# Patient Record
Sex: Male | Born: 1935 | Hispanic: No | Smoking: Former smoker
Health system: Southern US, Community
[De-identification: ages and names within clinical notes are randomized; demographics above are authoritative.]

## PROBLEM LIST (undated history)

## (undated) DIAGNOSIS — E079 Disorder of thyroid, unspecified: Secondary | ICD-10-CM

## (undated) DIAGNOSIS — I10 Essential (primary) hypertension: Secondary | ICD-10-CM

## (undated) DIAGNOSIS — S42309A Unspecified fracture of shaft of humerus, unspecified arm, initial encounter for closed fracture: Secondary | ICD-10-CM

## (undated) DIAGNOSIS — I509 Heart failure, unspecified: Secondary | ICD-10-CM

---

## 2005-02-03 ENCOUNTER — Ambulatory Visit: Payer: Self-pay

## 2009-10-08 ENCOUNTER — Emergency Department: Payer: Self-pay | Admitting: Emergency Medicine

## 2013-09-04 ENCOUNTER — Inpatient Hospital Stay: Payer: Self-pay | Admitting: Internal Medicine

## 2013-09-04 LAB — COMPREHENSIVE METABOLIC PANEL
Albumin: 3 g/dL — ABNORMAL LOW (ref 3.4–5.0)
Alkaline Phosphatase: 75 U/L (ref 50–136)
Anion Gap: 0 — ABNORMAL LOW (ref 7–16)
Chloride: 104 mmol/L (ref 98–107)
Co2: 34 mmol/L — ABNORMAL HIGH (ref 21–32)
Creatinine: 1.82 mg/dL — ABNORMAL HIGH (ref 0.60–1.30)
EGFR (African American): 36 — ABNORMAL LOW
Potassium: 3.6 mmol/L (ref 3.5–5.1)
SGOT(AST): 26 U/L (ref 15–37)
Sodium: 138 mmol/L (ref 136–145)

## 2013-09-04 LAB — URINALYSIS, COMPLETE
Bilirubin,UR: NEGATIVE
Glucose,UR: NEGATIVE mg/dL (ref 0–75)
Hyaline Cast: 5
Leukocyte Esterase: NEGATIVE
Nitrite: NEGATIVE
Protein: 30
RBC,UR: NONE SEEN /HPF (ref 0–5)
Specific Gravity: 1.023 (ref 1.003–1.030)
Squamous Epithelial: NONE SEEN
WBC UR: 1 /HPF (ref 0–5)

## 2013-09-04 LAB — DIFFERENTIAL
Basophil #: 0 10*3/uL (ref 0.0–0.1)
Eosinophil #: 0.1 10*3/uL (ref 0.0–0.7)
Monocyte #: 0.6 x10 3/mm (ref 0.2–1.0)
Monocyte %: 6.3 %
Neutrophil #: 7.5 10*3/uL — ABNORMAL HIGH (ref 1.4–6.5)
Neutrophil %: 85.6 %

## 2013-09-04 LAB — BILIRUBIN, DIRECT: Bilirubin, Direct: 0.4 mg/dL — ABNORMAL HIGH (ref 0.00–0.20)

## 2013-09-04 LAB — CBC
HCT: 34.7 % — ABNORMAL LOW (ref 40.0–52.0)
Platelet: 182 10*3/uL (ref 150–440)

## 2013-09-05 LAB — COMPREHENSIVE METABOLIC PANEL WITH GFR
Albumin: 2.6 g/dL — ABNORMAL LOW
Alkaline Phosphatase: 68 U/L
Anion Gap: 3 — ABNORMAL LOW
BUN: 23 mg/dL — ABNORMAL HIGH
Bilirubin,Total: 1.4 mg/dL — ABNORMAL HIGH
Calcium, Total: 8.5 mg/dL
Chloride: 105 mmol/L
Co2: 32 mmol/L
Creatinine: 1.5 mg/dL — ABNORMAL HIGH
EGFR (African American): 45 — ABNORMAL LOW
EGFR (Non-African Amer.): 39 — ABNORMAL LOW
Glucose: 101 mg/dL — ABNORMAL HIGH
Osmolality: 283
Potassium: 4.1 mmol/L
SGOT(AST): 21 U/L
SGPT (ALT): 14 U/L
Sodium: 140 mmol/L
Total Protein: 6.3 g/dL — ABNORMAL LOW

## 2013-09-05 LAB — CBC WITH DIFFERENTIAL/PLATELET
Basophil #: 0 10*3/uL (ref 0.0–0.1)
Eosinophil #: 0 10*3/uL (ref 0.0–0.7)
Eosinophil %: 0.5 %
HGB: 11 g/dL — ABNORMAL LOW (ref 13.0–18.0)
Lymphocyte #: 0.5 10*3/uL — ABNORMAL LOW (ref 1.0–3.6)
Lymphocyte %: 5.5 %
MCH: 35.1 pg — ABNORMAL HIGH (ref 26.0–34.0)
MCHC: 34.5 g/dL (ref 32.0–36.0)
MCV: 102 fL — ABNORMAL HIGH (ref 80–100)
Monocyte #: 0.6 x10 3/mm (ref 0.2–1.0)
Neutrophil %: 86.8 %
RBC: 3.13 10*6/uL — ABNORMAL LOW (ref 4.40–5.90)
RDW: 13.1 % (ref 11.5–14.5)
WBC: 8.3 10*3/uL (ref 3.8–10.6)

## 2013-09-05 LAB — TROPONIN I: Troponin-I: 0.03 ng/mL

## 2013-09-06 LAB — BASIC METABOLIC PANEL
Anion Gap: 3 — ABNORMAL LOW (ref 7–16)
Calcium, Total: 9 mg/dL (ref 8.5–10.1)
Chloride: 107 mmol/L (ref 98–107)
Glucose: 150 mg/dL — ABNORMAL HIGH (ref 65–99)
Osmolality: 286 (ref 275–301)
Sodium: 139 mmol/L (ref 136–145)

## 2013-09-06 LAB — CBC WITH DIFFERENTIAL/PLATELET
Basophil %: 0 %
Eosinophil #: 0 10*3/uL (ref 0.0–0.7)
Eosinophil %: 0 %
HCT: 30.4 % — ABNORMAL LOW (ref 40.0–52.0)
HGB: 10.7 g/dL — ABNORMAL LOW (ref 13.0–18.0)
Lymphocyte %: 5.2 %
MCH: 35.3 pg — ABNORMAL HIGH (ref 26.0–34.0)
MCHC: 35.1 g/dL (ref 32.0–36.0)
Monocyte #: 0.1 x10 3/mm — ABNORMAL LOW (ref 0.2–1.0)
Monocyte %: 1.7 %
Neutrophil #: 7 10*3/uL — ABNORMAL HIGH (ref 1.4–6.5)
Neutrophil %: 93.1 %
Platelet: 189 10*3/uL (ref 150–440)
RBC: 3.03 10*6/uL — ABNORMAL LOW (ref 4.40–5.90)

## 2013-09-07 LAB — BASIC METABOLIC PANEL
Anion Gap: 5 — ABNORMAL LOW (ref 7–16)
BUN: 34 mg/dL — ABNORMAL HIGH (ref 7–18)
Co2: 29 mmol/L (ref 21–32)
EGFR (African American): 43 — ABNORMAL LOW
EGFR (Non-African Amer.): 38 — ABNORMAL LOW
Glucose: 153 mg/dL — ABNORMAL HIGH (ref 65–99)
Osmolality: 290 (ref 275–301)
Sodium: 140 mmol/L (ref 136–145)

## 2013-09-09 LAB — CULTURE, BLOOD (SINGLE)

## 2013-10-04 ENCOUNTER — Emergency Department: Payer: Self-pay

## 2013-10-04 LAB — BASIC METABOLIC PANEL
Anion Gap: 7 (ref 7–16)
Calcium, Total: 9.4 mg/dL (ref 8.5–10.1)
Chloride: 103 mmol/L (ref 98–107)
Co2: 28 mmol/L (ref 21–32)
EGFR (African American): 28 — ABNORMAL LOW
Glucose: 136 mg/dL — ABNORMAL HIGH (ref 65–99)
Osmolality: 282 (ref 275–301)
Potassium: 4.3 mmol/L (ref 3.5–5.1)
Sodium: 138 mmol/L (ref 136–145)

## 2013-10-04 LAB — URINALYSIS, COMPLETE
Blood: NEGATIVE
Hyaline Cast: 8
Ketone: NEGATIVE
Nitrite: NEGATIVE
Ph: 5 (ref 4.5–8.0)
Protein: NEGATIVE
RBC,UR: <1 /HPF (ref 0–5)
Specific Gravity: 1.009 (ref 1.003–1.030)

## 2013-10-04 LAB — CBC
HCT: 34.8 % — ABNORMAL LOW (ref 40.0–52.0)
HGB: 11.8 g/dL — ABNORMAL LOW (ref 13.0–18.0)
MCV: 103 fL — ABNORMAL HIGH (ref 80–100)
Platelet: 232 10*3/uL (ref 150–440)
WBC: 5.1 10*3/uL (ref 3.8–10.6)

## 2015-02-22 NOTE — Discharge Summary (Signed)
PATIENT NAME:  Jermaine Mcclain, Jermaine Mcclain MR#:  [id number] DATE OF BIRTH:  01/28/1915  DATE OF ADMISSION:  09/04/2013 DATE OFMarisa Cyphers PLANNED DISCHARGE:  09/07/2013    DISCHARGE DIAGNOSES:  1. Acute respiratory failure from chronic obstructive pulmonary disease exacerbation and pneumonia.  2. Pneumonia.  3. Chronic obstructive pulmonary disease exacerbation.  4. Encephalopathy. from hypoxia and pneumonia.   DISCHARGE MEDICATIONS: Per [facility] medication reconciliation system. Basically, will have a prednisone taper 60, decreasing 20 mg every 5 days until gone, as well as 4 days of Levaquin 500 mg daily and Advair and tiotropium inhalers for now. He will stay on his other medicines.   HISTORY AND PHYSICAL: Please see detailed history and physical done on admission.   HOSPITAL COURSE: The patient was admitted for the above, stabilized on IV Levaquin initially, though had more and more wheezing, which improved markedly with IV steroids 20 mg Solu-Medrol b.i.d. It did not seem to significantly worsen his breathing. He remained somewhat hypoxic, although he was 96% on 2 liters today. Will see if he needs home oxygen for now, hopefully will clear as the pneumonia clears. Try to get home health nursing to watch him closely given his confusion and the fact that he is 89, lives alone, etc. He did not appear to need home health physical therapy as he walked 250 feet yesterday. His chronic kidney disease is stable with a creatinine of 1.5 presently. WBC 7.5, hemoglobin is 10.7 today, likely related to the CKD, as noted. Troponins were negative, done on admission. Blood cultures were negative at 48 hours. Creatinine initially was 1.82 notably.    TIME SPENT: Please note, it took approximately 35 minutes to do all discharge tasks today.   ____________________________ Marya AmslerMarshall Mcclain. Dareen PianoAnderson, MD mwa:lb D: 09/07/2013 08:10:17 ET T: 09/07/2013 08:19:45 ET JOB#: [id number]  cc: Marya AmslerMarshall Mcclain. Dareen PianoAnderson, MD, <Dictator> Lauro RegulusMARSHALL Mcclain ANDERSON  MD ELECTRONICALLY SIGNED 09/07/2013 13:15

## 2015-02-22 NOTE — H&P (Signed)
PATIENT NAME:  Marisa CyphersGARLAND, Akshat W MR#:  [id number] DATE OF BIRTH:  07-07-1915  PRIMARY CARE PHYSICIAN: Dr. Dareen PianoAnderson.  HISTORY OF PRESENT ILLNESS: The patient is a 81 year old Caucasian male with past medical history significant for history of dementia, hypertension, hypothyroidism, B12 deficiency, neuropathy in the right lower extremity, who presents to the hospital with complaints of generalized weakness. According to the patient's daughters who are present during my interview, the patient has been living in independent living facility. He has been noted to be very weak on Sunday as well as Saturday over the past 2 days.   He also has productive cough. He was not able to get up and go to church yesterday due to generalized weakness.   On arrival to the Emergency Room, he was noted to have O2 saturations of 85% to 89% on room air. He was given nasal cannula oxygen and hospitalist services were contacted for admission since his chest x-ray revealed left lower lobe pneumonia.   PAST MEDICAL HISTORY: Significant for history of hypertension, hypothyroidism, B12 deficiency, neuropathy, dementia.   MEDICATIONS: Per medical records, the patient is on amlodipine 2.5 mg p.o. daily, aspirin 81 mg p.o. daily, Centrum Silver multivitamins 1 daily, levothyroxine 125 mcg daily, Tylenol 325 mg one tablet in the morning and 2 tablets at bedtime, vitamin B12 500 mg daily at bedtime.   PAST SURGICAL HISTORY: Teeth extraction.   ALLERGIES: GABAPENTIN WHICH GIVES HIM SOMNOLENCE.   FAMILY HISTORY: The patient's father had rheumatoid arthritis, sister, diabetes mellitus. The patient's son died of surgical complications.   SOCIAL HISTORY: The patient is divorced, has 3 girls who live close by. Used to smoke, however quit 50 years ago. No alcohol abuse. He lives in independent living facility. He used to be in Control and instrumentation engineerfurniture sales.   REVIEW OF SYSTEMS: Difficult to obtain as the patient is demented. However, denies any  shortness of breath, chest pains or weakness. Admits of having some right foot pain.   PHYSICAL EXAMINATION:  VITAL SIGNS: On arrival to the Emergency Room, temperature was 98.2, pulse was 86, respiratory rate was 16, blood pressure 108/56, saturation was 89% on room air.  GENERAL: A well-developed, well-nourished Caucasian male, somewhat pale, sitting on the stretcher.  HEENT: Pupils are equal, reactive to light. Extraocular movements intact. No icterus or conjunctivitis. Has difficulty hearing. He has hearing aids ears in his ears. NECK: No masses, supple, nontender. Thyroid is not enlarged. No adenopathy. No JVD or carotid bruits. Bilaterally full range of motion.  LUNGS: Not clear to auscultation in all fields. The patient does have crackles in the left side of his lungs anteriorly as well as posteriorly. A few rhonchi were heard as well as diminished breath sounds on the left. No wheezing. No labored inspirations. Increased effort to breathe. No dullness to percussion. Not in overt respiratory distress.  CARDIOVASCULAR: S1, S2 appreciated. No murmurs, gallops, or rubs were noted. Heart  rhythm was regular.  PMI not lateralized. Chest is nontender to palpation.  EXTREMITIES: 1 plus dorsalis pedis pulses. No lower extremity edema, calf tenderness or cyanosis were noted.  ABDOMEN: Soft, nontender. Bowel sounds are present. No organomegaly or masses were noted.  RECTAL: Deferred.  MUSCLE STRENGTH: Able to move all extremities. No cyanosis, degenerative joint disease or kyphosis. Gait is not tested.  SKIN: No skin rashes, lesions, erythema, nodularity or induration. Warm and dry to palpation.  LYMPHATIC: No adenopathy in the cervical region.  NEUROLOGICAL: Cranial nerves grossly intact. Sensory is intact. No dysarthria, aphasia.  The patient is alert, oriented to person and place, cooperative; however, memory is significantly impaired. He is confused but no agitation or depression noted.   LABORATORY  DATA: BMP showed a glucose of 105, BUN and creatinine were 25 and 1.82, respectively. Otherwise, BMP was unremarkable. The patient's bicarbonate level was high at 34. Estimated GFR for non-African American would be 31. Liver enzymes revealed a total bilirubin elevation to 1.7 and albumin level of 3.0, otherwise unremarkable. Troponin level is 0.03. White blood cell count was normal at 8.7, hemoglobin was 11.9, platelet count 182, MCV high at 102.   Chest x-ray, PA and lateral, 3rd of November 2014, revealed atelectasis versus infiltrate in left lower lobe.   EKG showed sinus rhythm with first-degree AV block at 83 beats per minute, normal axis, inferior infarct, age undetermined. No acute ST-T changes were noted.   ASSESSMENT AND PLAN:  1.  Acute respiratory failure. Admit the patient to the medical floor. Continue him on oxygen through nasal cannula.  2.  Pneumonia. Will initiate the patient on Levaquin. We will get sputum cultures.  3.  Acute renal failure. We will continue IV fluids, get urinalysis as well as postvoid residual. Place Foley catheter if needed.  4.  Anemia. Get guaiac.  5.  Elevated total bilirubin. Get direct bilirubin.  6.  Relative hypotension. Will continue IV fluids and we will hold Norvasc at this time.   TIME SPENT: 50 minutes on this patient.   ____________________________ Katharina Caper, MD rv:np D: 09/04/2013 18:04:01 ET T: 09/04/2013 18:32:48 ET JOB#: [id number]  cc: Katharina Caper, MD, <Dictator> Marya Amsler. Dareen Piano, MD Katharina Caper MD ELECTRONICALLY SIGNED 10/10/2013 15:28

## 2015-10-31 ENCOUNTER — Emergency Department: Payer: Medicare Other

## 2015-10-31 ENCOUNTER — Emergency Department
Admission: EM | Admit: 2015-10-31 | Discharge: 2015-10-31 | Disposition: A | Discharge status: Home / Self Care | Payer: Medicare Other | Attending: Emergency Medicine | Admitting: Emergency Medicine

## 2015-10-31 DIAGNOSIS — Y9289 Other specified places as the place of occurrence of the external cause: Secondary | ICD-10-CM | POA: Insufficient documentation

## 2015-10-31 DIAGNOSIS — Y998 Other external cause status: Secondary | ICD-10-CM | POA: Insufficient documentation

## 2015-10-31 DIAGNOSIS — S6992XA Unspecified injury of left wrist, hand and finger(s), initial encounter: Secondary | ICD-10-CM | POA: Diagnosis present

## 2015-10-31 DIAGNOSIS — Y9389 Activity, other specified: Secondary | ICD-10-CM | POA: Insufficient documentation

## 2015-10-31 DIAGNOSIS — S0990XA Unspecified injury of head, initial encounter: Secondary | ICD-10-CM | POA: Diagnosis not present

## 2015-10-31 DIAGNOSIS — S52592A Other fractures of lower end of left radius, initial encounter for closed fracture: Secondary | ICD-10-CM | POA: Insufficient documentation

## 2015-10-31 DIAGNOSIS — W19XXXA Unspecified fall, initial encounter: Secondary | ICD-10-CM

## 2015-10-31 DIAGNOSIS — Z87891 Personal history of nicotine dependence: Secondary | ICD-10-CM | POA: Diagnosis not present

## 2015-10-31 DIAGNOSIS — W01198A Fall on same level from slipping, tripping and stumbling with subsequent striking against other object, initial encounter: Secondary | ICD-10-CM | POA: Diagnosis not present

## 2015-10-31 DIAGNOSIS — S52502A Unspecified fracture of the lower end of left radius, initial encounter for closed fracture: Secondary | ICD-10-CM

## 2015-10-31 DIAGNOSIS — I10 Essential (primary) hypertension: Secondary | ICD-10-CM | POA: Insufficient documentation

## 2015-10-31 DIAGNOSIS — S60222A Contusion of left hand, initial encounter: Secondary | ICD-10-CM | POA: Diagnosis not present

## 2015-10-31 HISTORY — DX: Essential (primary) hypertension: I10

## 2015-10-31 HISTORY — DX: Heart failure, unspecified: I50.9

## 2015-10-31 HISTORY — DX: Disorder of thyroid, unspecified: E07.9

## 2015-10-31 MED ORDER — OXYCODONE-ACETAMINOPHEN 5-325 MG PO TABS
ORAL_TABLET | ORAL | Status: AC
  Filled 2015-10-31: qty 1

## 2015-10-31 MED ORDER — OXYCODONE-ACETAMINOPHEN 5-325 MG PO TABS: 1.0000 | ORAL_TABLET | Freq: Four times a day (QID) | ORAL | Status: DC | PRN

## 2015-10-31 MED ORDER — OXYCODONE-ACETAMINOPHEN 5-325 MG PO TABS
1.0000 | ORAL_TABLET | Freq: Once | ORAL | Status: AC
  Administered 2015-10-31: 1 via ORAL

## 2015-10-31 NOTE — Discharge Instructions (Signed)
Wrist Fracture °A wrist fracture is a break or crack in one of the bones of your wrist. Your wrist is made up of eight small bones at the palm of your hand (carpal bones) and two long bones that make up your forearm (radius and ulna). °CAUSES °· A direct blow to the wrist. °· Falling on an outstretched hand. °· Trauma, such as a car accident or a fall. °RISK FACTORS °Risk factors for wrist fracture include: °· Participating in contact and high-risk sports, such as skiing, biking, and ice skating. °· Taking steroid medicines. °· Smoking. °· Being male. °· Being Caucasian. °· Drinking more than three alcoholic beverages per day. °· Having low or lowered bone density (osteoporosis or osteopenia). °· Age. Older adults have decreased bone density. °· Women who have had menopause. °· History of previous fractures. °SIGNS AND SYMPTOMS °Symptoms of wrist fractures include tenderness, bruising, and inflammation. Additionally, the wrist may hang in an odd position or appear deformed. °DIAGNOSIS °Diagnosis may include: °· Physical exam. °· X-ray. °TREATMENT °Treatment depends on many factors, including the nature and location of the fracture, your age, and your activity level. Treatment for wrist fracture can be nonsurgical or surgical. °Nonsurgical Treatment °A plaster cast or splint may be applied to your wrist if the bone is in a good position. If the fracture is not in good position, it may be necessary for your health care provider to realign it before applying a splint or cast. Usually, a cast or splint will be worn for several weeks. °Surgical Treatment °Sometimes the position of the bone is so far out of place that surgery is required to apply a device to hold it together as it heals. Depending on the fracture, there are a number of options for holding the bone in place while it heals, such as a cast and metal pins. °HOME CARE INSTRUCTIONS °· Keep your injured wrist elevated and move your fingers as much as  possible. °· Do not put pressure on any part of your cast or splint. It may break. °· Use a plastic bag to protect your cast or splint from water while bathing or showering. Do not lower your cast or splint into water. °· Take medicines only as directed by your health care provider. °· Keep your cast or splint clean and dry. If it becomes wet, damaged, or suddenly feels too tight, contact your health care provider right away. °· Do not use any tobacco products including cigarettes, chewing tobacco, or electronic cigarettes. Tobacco can delay bone healing. If you need help quitting, ask your health care provider. °· Keep all follow-up visits as directed by your health care provider. This is important. °· Ask your health care provider if you should take supplements of calcium and vitamins C and D to promote bone healing. °SEEK MEDICAL CARE IF: °· Your cast or splint is damaged, breaks, or gets wet. °· You have a fever. °· You have chills. °· You have continued severe pain or more swelling than you did before the cast was put on. °SEEK IMMEDIATE MEDICAL CARE IF: °· Your hand or fingernails on the injured arm turn blue or gray, or feel cold or numb. °· You have decreased feeling in the fingers of your injured arm. °MAKE SURE YOU: °· Understand these instructions. °· Will watch your condition. °· Will get help right away if you are not doing well or get worse. °  °This information is not intended to replace advice given to you by your   health care provider. Make sure you discuss any questions you have with your health care provider. °  °Document Released: 07/29/2005 Document Revised: 07/10/2015 Document Reviewed: 11/06/2011 °Elsevier Interactive Patient Education ©2016 Elsevier Inc. ° °

## 2015-10-31 NOTE — ED Provider Notes (Signed)
[facility] ([place] Emergency Department Provider Note  Time seen: 5:56 PM  I have reviewed the triage vital signs and the nursing notes.   HISTORY  Chief Complaint Fall    HPI Dacoda Finlay Clear is a 81 y.o. male with a past medical history of hypertension, CHF, who presents the emergency department after a fall last night. According to the family and the patient fell backwards last night hitting his head on cement, denies loss of consciousness. They noted that the patient is continuing to complain of left hand/wrist pain so they brought him to the emergency department today. Patient denies any headache, or head pain. Patient does have a small hematoma to the back of his head. Patient complains of moderate left wrist pain worse with any type of movement.     Past Medical History  Diagnosis Date  . Hypertension   . CHF (congestive heart failure) (HCC)   . Thyroid disease     There are no active problems to display for this patient.   History reviewed. No pertinent past surgical history.  No current outpatient prescriptions on file.  Allergies Review of patient's allergies indicates no known allergies.  No family history on file.  Social History Social History  Substance Use Topics  . Smoking status: Former Games developer  . Smokeless tobacco: Never Used  . Alcohol Use: No    Review of Systems Constitutional: Negative for fever. Cardiovascular: Negative for chest pain. Respiratory: Negative for shortness of breath. Gastrointestinal: Negative for abdominal pain Genitourinary: Negative for dysuria. Musculoskeletal: Negative for back pain. Negative for neck pain. Positive for left wrist pain. Skin: Positive for ecchymosis of the left wrist/dorsum of left hand. Neurological: Negative for headache 10-point ROS otherwise negative.  ____________________________________________   PHYSICAL EXAM:  VITAL SIGNS: ED Triage Vitals  Enc Vitals Group   BP 10/31/15 1633 105/59 mmHg     Pulse Rate 10/31/15 1633 81     Resp 10/31/15 1633 18     Temp 10/31/15 1633 98.7 F (37.1 C)     Temp Source 10/31/15 1633 Oral     SpO2 10/31/15 1636 95 %     Weight 10/31/15 1633 161 lb (73.029 kg)     Height 10/31/15 1633  (1.803 m)     Head Cir --      Peak Flow --      Pain Score 10/31/15 1635 5     Pain Loc --      Pain Edu? --      Excl. in GC? --     Constitutional: Alert and oriented. Well appearing and in no distress. Eyes: Normal exam ENT   Head: Normocephalic and atraumatic.   Mouth/Throat: Mucous membranes are moist. Cardiovascular: Normal rate, regular rhythm. No murmur Respiratory: Normal respiratory effort without tachypnea nor retractions. Breath sounds are clear Gastrointestinal: Soft and nontender. No distention.   Musculoskeletal: Moderate tenderness palpation of the left wrist. Ecchymosis of the dorsum of the left hand. Neurovascularly intact. Neurologic:  Normal speech and language. No gross focal neurologic deficits Skin:  Skin is warm, dry and intact.  Psychiatric: Mood and affect are normal. Speech and behavior are normal.   ____________________________________________      RADIOLOGY  CT head is negative for any intracranial abnormality. X-ray shows nondisplaced distal radius fracture.  INITIAL IMPRESSION / ASSESSMENT AND PLAN / ED COURSE  Pertinent labs & imaging results that were available during my care of the patient were reviewed by me and considered in  my medical decision making (see chart for details).  Patient presents after a fall last night. Continues to have left wrist pain so they brought him to the emergency department. Patient appears overall very well. States he only thing wrong with him is his left wrist hurts. Patient has been ambulatory today without difficulty. Patient ambulates without a walker or cane. We will splint his left distal radius fracture with a volar splint, and have the  patient follow up with orthopedics in 2 weeks. We'll place the patient on pain medication, which the daughter states she will dose for the patient. ____________________________________________   FINAL CLINICAL IMPRESSION(S) / ED DIAGNOSES  left distal radius fracture fall  Minna AntisKevin Jance Siek, MD 10/31/15 1800

## 2015-10-31 NOTE — ED Notes (Signed)
Splint and follow-up instructions given to patient and patient's daughter.  Patient's daughter verbalized understanding.

## 2015-10-31 NOTE — ED Notes (Signed)
Pt here with family, states pt fell backwards last night hitting his head on concrete, has small hematoma to the back of head and bruising to the left wrist and hand.. Pt is a/ox4.. In NAD..Marland Kitchen

## 2015-11-11 ENCOUNTER — Emergency Department
Admission: EM | Admit: 2015-11-11 | Discharge: 2015-11-11 | Disposition: A | Discharge status: Home / Self Care | Payer: Medicare Other | Attending: Emergency Medicine | Admitting: Emergency Medicine

## 2015-11-11 ENCOUNTER — Emergency Department: Payer: Medicare Other

## 2015-11-11 ENCOUNTER — Encounter: Payer: Self-pay | Admitting: *Deleted

## 2015-11-11 DIAGNOSIS — S42401B Unspecified fracture of lower end of right humerus, initial encounter for open fracture: Secondary | ICD-10-CM | POA: Diagnosis not present

## 2015-11-11 DIAGNOSIS — Y9389 Activity, other specified: Secondary | ICD-10-CM | POA: Insufficient documentation

## 2015-11-11 DIAGNOSIS — S59901A Unspecified injury of right elbow, initial encounter: Secondary | ICD-10-CM | POA: Diagnosis present

## 2015-11-11 DIAGNOSIS — Z87891 Personal history of nicotine dependence: Secondary | ICD-10-CM | POA: Insufficient documentation

## 2015-11-11 DIAGNOSIS — Y998 Other external cause status: Secondary | ICD-10-CM | POA: Diagnosis not present

## 2015-11-11 DIAGNOSIS — W1839XA Other fall on same level, initial encounter: Secondary | ICD-10-CM | POA: Insufficient documentation

## 2015-11-11 DIAGNOSIS — Y9289 Other specified places as the place of occurrence of the external cause: Secondary | ICD-10-CM | POA: Diagnosis not present

## 2015-11-11 DIAGNOSIS — I10 Essential (primary) hypertension: Secondary | ICD-10-CM | POA: Diagnosis not present

## 2015-11-11 HISTORY — DX: Unspecified fracture of shaft of humerus, unspecified arm, initial encounter for closed fracture: S42.309A

## 2015-11-11 MED ORDER — CEPHALEXIN 500 MG PO CAPS
ORAL_CAPSULE | ORAL | Status: AC
  Administered 2015-11-11: 500 mg via ORAL
  Filled 2015-11-11: qty 1

## 2015-11-11 MED ORDER — CEPHALEXIN 500 MG PO CAPS
500.0000 mg | ORAL_CAPSULE | Freq: Once | ORAL | Status: AC
  Administered 2015-11-11: 500 mg via ORAL

## 2015-11-11 MED ORDER — CEPHALEXIN 500 MG PO CAPS: 500.0000 mg | ORAL_CAPSULE | Freq: Four times a day (QID) | ORAL | Status: DC

## 2015-11-11 MED ORDER — SULFAMETHOXAZOLE-TRIMETHOPRIM 800-160 MG PO TABS
1.0000 | ORAL_TABLET | Freq: Once | ORAL | Status: AC
  Administered 2015-11-11: 1 via ORAL

## 2015-11-11 MED ORDER — SULFAMETHOXAZOLE-TRIMETHOPRIM 800-160 MG PO TABS
ORAL_TABLET | ORAL | Status: AC
  Administered 2015-11-11: 1 via ORAL
  Filled 2015-11-11: qty 1

## 2015-11-11 MED ORDER — SULFAMETHOXAZOLE-TRIMETHOPRIM 800-160 MG PO TABS: 1.0000 | ORAL_TABLET | Freq: Two times a day (BID) | ORAL | Status: DC

## 2015-11-11 NOTE — ED Notes (Signed)
Patient fell on 12/31 and injured right elbow. Patient fractured right arm a few days earlier. Patient lives alone with daughter close by. Patient c/o increased pain to right elbow.

## 2015-11-11 NOTE — ED Notes (Signed)
Pt is in no acute distress at this time. Pt was taken out by daughter even though RN offered to take pt out. Arm wrapped with ace prior to d/c

## 2015-11-11 NOTE — Discharge Instructions (Signed)
As we discussed it is important that you follow up with orthopedics in the next 2-3 days. Please seek medical attention for any high fevers, chest pain, shortness of breath, change in behavior, persistent vomiting, bloody stool or any other new or concerning symptoms.  Elbow Bursitis Elbow bursitis is inflammation of the fluid-filled sac (bursa) between the tip of your elbow bone (olecranon) and your skin. Elbow bursitis may also be called olecranon bursitis. Normally, the olecranon bursa has only a small amount of fluid in it to cushion and protect your elbow bone. Elbow bursitis causes fluid to build up inside the bursa. Over time, this swelling and inflammation can cause pain when you bend or lean on your elbow.  CAUSES Elbow bursitis may be caused by:   Elbow injury (acute trauma).  Leaning on hard surfaces for long periods of time.  Infection from an injury that breaks the skin near your elbow.  A bone growth (spur) that forms at the tip of your elbow.  A medical condition that causes inflammation in your body, such as gout or rheumatoid arthritis.  The cause may also be unknown.  SIGNS AND SYMPTOMS  The first sign of elbow bursitis is usually swelling over the tip of your elbow. This can grow to be the size of a golf ball. This may start suddenly or develop gradually. You may also have:  Pain when bending or leaning on your elbow.  Restricted movement of your elbow.  If your bursitis is caused by an infection, symptoms may also include:  Redness, warmth, and tenderness of the elbow.  Drainage of pus from the swollen area over your elbow, if the skin breaks open. DIAGNOSIS  Your health care provider may be able to diagnose elbow bursitis based on your signs and symptoms, especially if you have recently been injured. Your health care provider will also do a physical exam. This may include:  X-rays to look for a bone spur or a bone fracture.  Draining fluid from the bursa to  test it for infection.  Blood tests to rule out gout or rheumatoid arthritis. TREATMENT  Treatment for elbow bursitis depends on the cause. Treatment may include:  Medicines. These may include:  Over-the-counter medicines to relieve pain and inflammation.  Antibiotic medicines to fight infection.  Injections of anti-inflammatory medicines (steroids).  Wrapping your elbow with a bandage.  Draining fluid from the bursa.  Wearing elbow pads.  If your bursitis does not get better with treatment, surgery may be needed to remove the bursa.  HOME CARE INSTRUCTIONS   Take medicines only as directed by your health care provider.  If you were prescribed an antibiotic medicine, finish all of it even if you start to feel better.  If your bursitis is caused by an injury, rest your elbow and wear your bandage as directed by your health care provider. You may alsoapply ice to the injured area as directed by your health care provider:  Put ice in a plastic bag.  Place a towel between your skin and the bag.  Leave the ice on for 20 minutes, 2-3 times per day.  Avoid any activities that cause elbow pain.  Use elbow pads or elbow wraps to cushion your elbow. SEEK MEDICAL CARE IF:  You have a fever.   Your symptoms do not get better with treatment.  Your pain or swelling gets worse.  Your elbow pain or swelling goes away and then returns.  You have drainage of pus from the  swollen area over your elbow.   This information is not intended to replace advice given to you by your health care provider. Make sure you discuss any questions you have with your health care provider.   Document Released: 11/18/2006 Document Revised: 11/09/2014 Document Reviewed: 06/27/2014 Elsevier Interactive Patient Education [employer]2016 Elsevier Inc.

## 2015-11-11 NOTE — ED Provider Notes (Signed)
[facility] Emergency Department Provider Note    ____________________________________________  Time seen: 1945  I have reviewed the triage vital signs and the nursing notes.   HISTORY  Chief Complaint Fall   History limited by: Not Limited, some history obtained from daughter   HPI Jermaine Mcclain is a 82 y.o. male who presents to the emergency department today because of right elbow pain and swelling. The patient was seen in the emergency department roughly two weeks ago after a fall in which he fractured his left wrist. Family states that the next day the patient fell and hurt his right elbow. It had been doing okay until a couple of days ago when the swelling increased and the pain increased. Family states that they noticed swelling and today noticed some clear fluid draining from the area of swelling. Per family the patient has been doing a little worse since the initial fall, he has been weaker and has not been moving or eating as much. No fevers.   Past Medical History  Diagnosis Date  . Hypertension   . CHF (congestive heart failure) (HCC)   . Thyroid disease   . Arm fracture left    There are no active problems to display for this patient.   No past surgical history on file.  Current Outpatient Rx  Name  Route  Sig  Dispense  Refill  . oxyCODONE-acetaminophen (ROXICET) 5-325 MG tablet   Oral   Take 1 tablet by mouth every 6 (six) hours as needed.   20 tablet   0     Allergies Review of patient's allergies indicates no known allergies.  No family history on file.  Social History Social History  Substance Use Topics  . Smoking status: Former Games developer  . Smokeless tobacco: Never Used  . Alcohol Use: No    Review of Systems  Constitutional: Negative for fever. Cardiovascular: Negative for chest pain. Respiratory: Negative for shortness of breath. Gastrointestinal: Negative for abdominal pain, vomiting and  diarrhea. Musculoskeletal: Positive for right elbow pain. Skin: Negative for rash. Neurological: Negative for headaches, focal weakness or numbness.   10-point ROS otherwise negative.  ____________________________________________   PHYSICAL EXAM:  VITAL SIGNS: ED Triage Vitals  Enc Vitals Group     BP 11/11/15 1832 118/89 mmHg     Pulse Rate 11/11/15 1832 86     Resp 11/11/15 1832 18     Temp 11/11/15 1832 97.5 F (36.4 C)     Temp Source 11/11/15 1832 Oral     SpO2 11/11/15 1832 95 %     Weight 11/11/15 1832 151 lb (68.493 kg)     Height 11/11/15 1832 5\' 10"  (1.778 m)     Head Cir --      Peak Flow --      Pain Score 11/11/15 1839 8   Constitutional: Alert and oriented. Well appearing and in no distress. Eyes: Conjunctivae are normal. PERRL. Normal extraocular movements. ENT   Head: Normocephalic and atraumatic.   Nose: No congestion/rhinnorhea.   Mouth/Throat: Mucous membranes are moist.   Neck: No stridor. Hematological/Lymphatic/Immunilogical: No cervical lymphadenopathy. Cardiovascular: Normal rate, regular rhythm.  No murmurs, rubs, or gallops. Respiratory: Normal respiratory effort without tachypnea nor retractions. Breath sounds are clear and equal bilaterally. No wheezes/rales/rhonchi. Gastrointestinal: Soft and nontender. No distention.  Genitourinary: Deferred Musculoskeletal: Left wrist in splint. Right elbow with some swelling over the olecronon process consistent with bursitis. Healing wounds over that same area. Some erythema to that area.  Neurologic:  Normal speech and language. No gross focal neurologic deficits are appreciated.  Skin:  Skin is warm, dry and intact. No rash noted. Psychiatric: Mood and affect are normal. Speech and behavior are normal. Patient exhibits appropriate insight and judgment.  ____________________________________________    LABS (pertinent  positives/negatives)  None  ____________________________________________   EKG  None  ____________________________________________    RADIOLOGY  Right Elbow IMPRESSION: 1. Possible fracture through an enthesophyte off the olecranon process. No other acute displaced elbow fracture is identified. 2. Extensive soft tissue swelling posterior to the left elbow joint.  ____________________________________________   PROCEDURES  Procedure(s) performed: None  Critical Care performed: No  ____________________________________________   INITIAL IMPRESSION / ASSESSMENT AND PLAN / ED COURSE  Pertinent labs & imaging results that were available during my care of the patient were reviewed by me and considered in my medical decision making (see chart for details).  Patient presented to the emergency department today because of concerns for right elbow pain after a fall. There is some swelling around the olecranon bursa. Furthermore x-ray was performed which showed a possible fracture through an enthesophyte off the olecranon process. I discussed these findings with Dr. Hyacinth MeekerMiller who recommended prophylactic antibiotics as well as splinting. I discussed these findings and recommendations with patient and family. The patient refused to have his right elbow splinted. I did discuss that this might lead to decreased range of motion and chronic pain issues. The patient understood this however given recent left radial fracture did not want his right elbow splinted. He does have fairly good range of motion at this point. Additionally we discussed antibiotics and I wrote for Keflex and Septra. Family does have some concern about the patient's ability to take Keflex 4 times a day. I did encourage them to try as hard as they could. Additionally I did encourage them to continue to follow-up with orthopedic surgery.  ____________________________________________   FINAL CLINICAL IMPRESSION(S) / ED  DIAGNOSES  Final diagnoses:  Elbow fracture, right, open, initial encounter     Phineas SemenGraydon Vegas Fritze, MD 11/11/15 2050

## 2015-11-11 NOTE — ED Notes (Signed)
Fell yetserday / c/o pain right elbow

## 2015-11-16 ENCOUNTER — Inpatient Hospital Stay
Admission: EM | Admit: 2015-11-16 | Discharge: 2015-11-19 | DRG: 683 | Disposition: A | Discharge status: Skilled Nursing Facility | Attending: Internal Medicine | Admitting: Internal Medicine

## 2015-11-16 ENCOUNTER — Emergency Department

## 2015-11-16 DIAGNOSIS — E039 Hypothyroidism, unspecified: Secondary | ICD-10-CM | POA: Diagnosis present

## 2015-11-16 DIAGNOSIS — R748 Abnormal levels of other serum enzymes: Secondary | ICD-10-CM

## 2015-11-16 DIAGNOSIS — Z7982 Long term (current) use of aspirin: Secondary | ICD-10-CM

## 2015-11-16 DIAGNOSIS — W19XXXA Unspecified fall, initial encounter: Secondary | ICD-10-CM | POA: Diagnosis present

## 2015-11-16 DIAGNOSIS — R7989 Other specified abnormal findings of blood chemistry: Secondary | ICD-10-CM

## 2015-11-16 DIAGNOSIS — L8990 Pressure ulcer of unspecified site, unspecified stage: Secondary | ICD-10-CM | POA: Insufficient documentation

## 2015-11-16 DIAGNOSIS — R296 Repeated falls: Secondary | ICD-10-CM | POA: Diagnosis present

## 2015-11-16 DIAGNOSIS — T370X5A Adverse effect of sulfonamides, initial encounter: Secondary | ICD-10-CM | POA: Diagnosis present

## 2015-11-16 DIAGNOSIS — E860 Dehydration: Secondary | ICD-10-CM | POA: Diagnosis present

## 2015-11-16 DIAGNOSIS — R778 Other specified abnormalities of plasma proteins: Secondary | ICD-10-CM

## 2015-11-16 DIAGNOSIS — N189 Chronic kidney disease, unspecified: Secondary | ICD-10-CM | POA: Diagnosis present

## 2015-11-16 DIAGNOSIS — Y92009 Unspecified place in unspecified non-institutional (private) residence as the place of occurrence of the external cause: Secondary | ICD-10-CM | POA: Diagnosis not present

## 2015-11-16 DIAGNOSIS — Z87891 Personal history of nicotine dependence: Secondary | ICD-10-CM

## 2015-11-16 DIAGNOSIS — I509 Heart failure, unspecified: Secondary | ICD-10-CM | POA: Diagnosis present

## 2015-11-16 DIAGNOSIS — N179 Acute kidney failure, unspecified: Principal | ICD-10-CM | POA: Diagnosis present

## 2015-11-16 DIAGNOSIS — N183 Chronic kidney disease, stage 3 (moderate): Secondary | ICD-10-CM | POA: Diagnosis present

## 2015-11-16 DIAGNOSIS — I130 Hypertensive heart and chronic kidney disease with heart failure and stage 1 through stage 4 chronic kidney disease, or unspecified chronic kidney disease: Secondary | ICD-10-CM | POA: Diagnosis present

## 2015-11-16 DIAGNOSIS — Z79899 Other long term (current) drug therapy: Secondary | ICD-10-CM | POA: Diagnosis not present

## 2015-11-16 DIAGNOSIS — W19XXXD Unspecified fall, subsequent encounter: Secondary | ICD-10-CM

## 2015-11-16 LAB — CBC WITH DIFFERENTIAL/PLATELET
Basophils Absolute: 0.1 10*3/uL (ref 0–0.1)
Basophils Relative: 1 %
Eosinophils Absolute: 0.1 10*3/uL (ref 0–0.7)
Eosinophils Relative: 1 %
HCT: 34.9 % — ABNORMAL LOW (ref 40.0–52.0)
HEMOGLOBIN: 11.8 g/dL — AB (ref 13.0–18.0)
LYMPHS ABS: 0.6 10*3/uL — AB (ref 1.0–3.6)
LYMPHS PCT: 6 %
MCH: 33 pg (ref 26.0–34.0)
MCHC: 33.9 g/dL (ref 32.0–36.0)
MCV: 97.6 fL (ref 80.0–100.0)
MONOS PCT: 6 %
Monocytes Absolute: 0.6 10*3/uL (ref 0.2–1.0)
NEUTROS PCT: 86 %
Neutro Abs: 8.9 10*3/uL — ABNORMAL HIGH (ref 1.4–6.5)
Platelets: 339 10*3/uL (ref 150–440)
RBC: 3.58 MIL/uL — AB (ref 4.40–5.90)
RDW: 13 % (ref 11.5–14.5)
WBC: 10.2 10*3/uL (ref 3.8–10.6)

## 2015-11-16 LAB — COMPREHENSIVE METABOLIC PANEL
ALK PHOS: 68 U/L (ref 38–126)
ALT: 23 U/L (ref 17–63)
AST: 59 U/L — ABNORMAL HIGH (ref 15–41)
Albumin: 3.1 g/dL — ABNORMAL LOW (ref 3.5–5.0)
Anion gap: 13 (ref 5–15)
BILIRUBIN TOTAL: 0.8 mg/dL (ref 0.3–1.2)
BUN: 43 mg/dL — ABNORMAL HIGH (ref 6–20)
CALCIUM: 9.3 mg/dL (ref 8.9–10.3)
CO2: 20 mmol/L — ABNORMAL LOW (ref 22–32)
CREATININE: 4.29 mg/dL — AB (ref 0.61–1.24)
Chloride: 107 mmol/L (ref 101–111)
GFR, EST AFRICAN AMERICAN: 12 mL/min — AB (ref >60)
GFR, EST NON AFRICAN AMERICAN: 10 mL/min — AB (ref >60)
Glucose, Bld: 99 mg/dL (ref 65–99)
Potassium: 5.4 mmol/L — ABNORMAL HIGH (ref 3.5–5.1)
Sodium: 140 mmol/L (ref 135–145)
TOTAL PROTEIN: 6.9 g/dL (ref 6.5–8.1)

## 2015-11-16 LAB — URINALYSIS COMPLETE WITH MICROSCOPIC (ARMC ONLY)
BACTERIA UA: NONE SEEN
Bilirubin Urine: NEGATIVE
Glucose, UA: NEGATIVE mg/dL
HGB URINE DIPSTICK: NEGATIVE
KETONES UR: NEGATIVE mg/dL
LEUKOCYTES UA: NEGATIVE
NITRITE: NEGATIVE
PH: 5 (ref 5.0–8.0)
PROTEIN: NEGATIVE mg/dL
SPECIFIC GRAVITY, URINE: 1.018 (ref 1.005–1.030)

## 2015-11-16 LAB — TROPONIN I: TROPONIN I: 0.07 ng/mL — AB (ref <0.031)

## 2015-11-16 LAB — CK: CK TOTAL: 944 U/L — AB (ref 49–397)

## 2015-11-16 MED ORDER — HEPARIN SODIUM (PORCINE) 5000 UNIT/ML IJ SOLN: 5000.0000 [IU] | Freq: Three times a day (TID) | INTRAMUSCULAR | Status: DC

## 2015-11-16 MED ORDER — SODIUM CHLORIDE 0.9 % IV SOLN
1000.0000 mL | Freq: Once | INTRAVENOUS | Status: AC
  Administered 2015-11-16: 1000 mL via INTRAVENOUS

## 2015-11-16 MED ORDER — DOCUSATE SODIUM 100 MG PO CAPS
100.0000 mg | ORAL_CAPSULE | Freq: Two times a day (BID) | ORAL | Status: DC
  Administered 2015-11-16 – 2015-11-19 (×6): 100 mg via ORAL
  Filled 2015-11-16 (×6): qty 1

## 2015-11-16 MED ORDER — ACETAMINOPHEN 650 MG RE SUPP: 650.0000 mg | Freq: Four times a day (QID) | RECTAL | Status: DC | PRN

## 2015-11-16 MED ORDER — SODIUM POLYSTYRENE SULFONATE 15 GM/60ML PO SUSP: 30.0000 g | Freq: Once | ORAL | Status: DC

## 2015-11-16 MED ORDER — SODIUM CHLORIDE 0.9 % IV SOLN
INTRAVENOUS | Status: DC
  Administered 2015-11-16 – 2015-11-19 (×6): via INTRAVENOUS

## 2015-11-16 MED ORDER — ONDANSETRON HCL 4 MG/2ML IJ SOLN: 4.0000 mg | Freq: Four times a day (QID) | INTRAMUSCULAR | Status: DC | PRN

## 2015-11-16 MED ORDER — ACETAMINOPHEN 325 MG PO TABS
650.0000 mg | ORAL_TABLET | Freq: Four times a day (QID) | ORAL | Status: DC | PRN
  Administered 2015-11-17: 650 mg via ORAL
  Filled 2015-11-16: qty 2

## 2015-11-16 MED ORDER — HEPARIN SODIUM (PORCINE) 5000 UNIT/ML IJ SOLN
5000.0000 [IU] | Freq: Three times a day (TID) | INTRAMUSCULAR | Status: DC
  Administered 2015-11-16 – 2015-11-19 (×8): 5000 [IU] via SUBCUTANEOUS
  Filled 2015-11-16 (×9): qty 1

## 2015-11-16 MED ORDER — BISACODYL 5 MG PO TBEC: 5.0000 mg | DELAYED_RELEASE_TABLET | Freq: Every day | ORAL | Status: DC | PRN

## 2015-11-16 MED ORDER — LEVOTHYROXINE SODIUM 125 MCG PO TABS
125.0000 ug | ORAL_TABLET | Freq: Every day | ORAL | Status: DC
  Administered 2015-11-17 – 2015-11-19 (×3): 125 ug via ORAL
  Filled 2015-11-16 (×4): qty 1

## 2015-11-16 MED ORDER — ONDANSETRON HCL 4 MG PO TABS: 4.0000 mg | ORAL_TABLET | Freq: Four times a day (QID) | ORAL | Status: DC | PRN

## 2015-11-16 MED ORDER — MEGESTROL ACETATE 625 MG/5ML PO SUSP: 625.0000 mg | Freq: Every day | ORAL | Status: DC

## 2015-11-16 MED ORDER — MEGESTROL ACETATE 400 MG/10ML PO SUSP
800.0000 mg | Freq: Every day | ORAL | Status: DC
  Administered 2015-11-16 – 2015-11-19 (×4): 800 mg via ORAL
  Filled 2015-11-16 (×5): qty 20

## 2015-11-16 MED ORDER — DOCUSATE SODIUM 100 MG PO CAPS: 100.0000 mg | ORAL_CAPSULE | Freq: Two times a day (BID) | ORAL | Status: DC

## 2015-11-16 MED ORDER — CEPHALEXIN 250 MG PO CAPS
250.0000 mg | ORAL_CAPSULE | ORAL | Status: DC
  Administered 2015-11-16 – 2015-11-19 (×4): 250 mg via ORAL
  Filled 2015-11-16 (×5): qty 1

## 2015-11-16 MED ORDER — ADULT MULTIVITAMIN W/MINERALS CH
1.0000 | ORAL_TABLET | Freq: Every day | ORAL | Status: DC
  Administered 2015-11-16 – 2015-11-19 (×4): 1 via ORAL
  Filled 2015-11-16 (×4): qty 1

## 2015-11-16 MED ORDER — SODIUM POLYSTYRENE SULFONATE 15 GM/60ML PO SUSP
30.0000 g | Freq: Once | ORAL | Status: AC
  Administered 2015-11-16: 30 g via ORAL
  Filled 2015-11-16: qty 120

## 2015-11-16 MED ORDER — CYANOCOBALAMIN 500 MCG PO TABS
500.0000 ug | ORAL_TABLET | Freq: Every day | ORAL | Status: DC
  Administered 2015-11-16 – 2015-11-19 (×4): 500 ug via ORAL
  Filled 2015-11-16 (×4): qty 1

## 2015-11-16 MED ORDER — HYDROCODONE-ACETAMINOPHEN 5-325 MG PO TABS
1.0000 | ORAL_TABLET | ORAL | Status: DC | PRN
  Administered 2015-11-16: 1 via ORAL
  Filled 2015-11-16: qty 1

## 2015-11-16 NOTE — ED Notes (Signed)
Room changed to 1a per nursing supervisor

## 2015-11-16 NOTE — ED Notes (Signed)
Pt to toilet - put bed close for pivot transfer. Pt afraid to allow me to help him up and requested a male. Gerilyn PilgrimJacob to room to help pt. Troponin noted as 0.07

## 2015-11-16 NOTE — ED Provider Notes (Addendum)
[facility] Emergency Department Provider Note     Time seen: ----------------------------------------- 10:54 AM on 11/16/2015 -----------------------------------------  Who presents ER being brought after a fall.  I have reviewed the triage vital signs and the nursing notes.   HISTORY  Chief Complaint No chief complaint on file.    HPI Jermaine Mcclain is a 82 y.o. male who presents to ER for frequent falls. Family found him on the floor of his home, unsure how long his been on the ground. Patient fractured his right elbow and left wrist from a previous fall. He said multiple falls over the last week, family states he lives by himself and can no longer walk or stand. Patient denies any other complaints other than weakness. Family is noted his right hand was bruised.   Past Medical History  Diagnosis Date  . Hypertension   . CHF (congestive heart failure) (HCC)   . Thyroid disease   . Arm fracture left    There are no active problems to display for this patient.   No past surgical history on file.  Allergies Review of patient's allergies indicates no known allergies.  Social History Social History  Substance Use Topics  . Smoking status: Former Games developermoker  . Smokeless tobacco: Never Used  . Alcohol Use: No    Review of Systems Constitutional: Negative for fever. Eyes: Negative for visual changes. ENT: Negative for sore throat. Cardiovascular: Negative for chest pain. Respiratory: Negative for shortness of breath. Gastrointestinal: Negative for abdominal pain, vomiting and diarrhea. Genitourinary: Negative for dysuria. Musculoskeletal: Negative for back pain. Skin: Positive for bruising Neurological: Positive for weakness  10-point ROS otherwise negative.  ____________________________________________   PHYSICAL EXAM:  VITAL SIGNS: ED Triage Vitals  Enc Vitals Group     BP --      Pulse --      Resp --      Temp --      Temp src --      SpO2 --      Weight --      Height --      Head Cir --      Peak Flow --      Pain Score --      Pain Loc --      Pain Edu? --      Excl. in GC? --    Constitutional: Alert and oriented. Patient is drowsy but no acute distress Eyes: Conjunctivae are normal. PERRL. Normal extraocular movements. ENT   Head: Normocephalic and atraumatic.   Nose: No congestion/rhinnorhea.   Mouth/Throat: Mucous membranes are dry   Neck: No stridor. Cardiovascular: Normal rate, regular rhythm. Normal and symmetric distal pulses are present in all extremities. No murmurs, rubs, or gallops. Respiratory: Normal respiratory effort without tachypnea nor retractions. Breath sounds are clear and equal bilaterally. No wheezes/rales/rhonchi. Gastrointestinal: Soft and nontender. No distention. No abdominal bruits.  Musculoskeletal: Cast is located over the left wrist and forearm, bandage is present over the right elbow, contusion is noted right hand. Neurologic:  Normal speech and language. No gross focal neurologic deficits are appreciated. Speech is normal. No gait instability. Skin:  Skin is warm, dry and intact. No rash noted. Psychiatric: Mood and affect are normal. Speech and behavior are normal. Patient exhibits appropriate insight and judgment. ____________________________________________  EKG: Interpreted by me. Normal sinus rhythm with rate 86 bpm, first degree AV block, normal QRS, long QT. Normal axis.  ____________________________________________  ED COURSE:  Pertinent labs &  imaging results that were available during my care of the patient were reviewed by me and considered in my medical decision making (see chart for details). Patient with generalized weakness and recent falls. Patient is not capable living by himself anymore. He may need placement. ____________________________________________    LABS (pertinent positives/negatives)  Labs Reviewed  CBC WITH  DIFFERENTIAL/PLATELET - Abnormal; Notable for the following:    RBC 3.58 (*)    Hemoglobin 11.8 (*)    HCT 34.9 (*)    Neutro Abs 8.9 (*)    Lymphs Abs 0.6 (*)    All other components within normal limits  COMPREHENSIVE METABOLIC PANEL - Abnormal; Notable for the following:    Potassium 5.4 (*)    CO2 20 (*)    BUN 43 (*)    Creatinine, Ser 4.29 (*)    Albumin 3.1 (*)    AST 59 (*)    GFR calc non Af Amer 10 (*)    GFR calc Af Amer 12 (*)    All other components within normal limits  TROPONIN I - Abnormal; Notable for the following:    Troponin I 0.07 (*)    All other components within normal limits  CK - Abnormal; Notable for the following:    Total CK 944 (*)    All other components within normal limits  URINALYSIS COMPLETEWITH MICROSCOPIC ([facility] ONLY)  CBG MONITORING, ED   CRITICAL CARE Performed by: Emily Filbert   Total critical care time: 30 minutes  Critical care time was exclusive of separately billable procedures and treating other patients.  Critical care was necessary to treat or prevent imminent or life-threatening deterioration.  Critical care was time spent personally by me on the following activities: development of treatment plan with patient and/or surrogate as well as nursing, discussions with consultants, evaluation of patient's response to treatment, examination of patient, obtaining history from patient or surrogate, ordering and performing treatments and interventions, ordering and review of laboratory studies, ordering and review of radiographic studies, pulse oximetry and re-evaluation of patient's condition.   RADIOLOGY   right hand IMPRESSION: No fracture or dislocation. ____________________________________________  FINAL ASSESSMENT AND PLAN  .Fall, mild anemia, acute renal failure, elevated troponin, with a CK level   Plan: Patient with labs and imaging as dictated above. Patient will multiple laboratory abnormalities, patient is  dehydrated and has been laying on the ground indicative of the elevated CK level. Troponin may be demand related or related to the renal insufficiency. Creatinine has almost doubled compared to recent check. Patient will need to be admitted, given fluids and have serial troponins. Patient will also need physical therapy consult and likely rehabilitation placement once medically stable.   Emily Filbert, MD   Emily Filbert, MD 11/16/15 1306  Emily Filbert, MD 11/16/15 [phone]

## 2015-11-16 NOTE — ED Notes (Signed)
Pt up to toilet with assistance by tech.

## 2015-11-16 NOTE — ED Notes (Signed)
Pt requested i place his iv in the "big vein" (rt ac)

## 2015-11-16 NOTE — H&P (Signed)
[facility] -  at [facility]   PATIENT NAME: Jermaine Mcclain    MR#:  [id number]  DATE OF BIRTH:  01/15/1915  DATE OF ADMISSION:  11/16/2015  PRIMARY CARE PHYSICIAN: Lauro Regulus., MD   REQUESTING/REFERRING PHYSICIAN: Dr. Theressa Millard.  CHIEF COMPLAINT: Multiple falls    Chief Complaint  Patient presents with  . Fall    HISTORY OF PRESENT ILLNESS:  Jermaine Mcclain  is a 82 y.o. male with a known history of appetite present, hypertension brought in by the family secondary to multiple falls recently. Patient daughter found him on the floor this morning, unsure if he was on the floor overnight. Patient lives alone, having multiple falls, recently had seen Dr. Hyacinth Meeker for left wrist fracture, had a cast on fifth of January. Patient also was given Bactrim for right elbow infection  On Jan 9th. found to have worsening renal function, creatinine  upto 4.8.Havingambulatory difficulties, multiple falls.according to daughter ,Pt  To get hospice services at home supposed to start next week.  Past Medical History  Diagnosis Date  . Hypertension   . CHF (congestive heart failure) (HCC)   . Thyroid disease   . Arm fracture left    PAST SURGICAL HISTOIRY:  History reviewed. No pertinent past surgical history.  SOCIAL HISTORY:   Social History  Substance Use Topics  . Smoking status: Former Games developer  . Smokeless tobacco: Never Used  . Alcohol Use: No    FAMILY HISTORY:  History reviewed. No pertinent family history.  DRUG ALLERGIES:  No Known Allergies  REVIEW OF SYSTEMS:  CONSTITUTIONAL:  generalized weakness, multiple falls. Eyes ; No blurred or double vision.  EARS, NOSE, AND THROAT: No tinnitus or ear pain.  RESPIRATORY: No cough, shortness of breath, wheezing or hemoptysis.  CARDIOVASCULAR: No chest pain, orthopnea, edema.  GASTROINTESTINAL: No nausea, vomiting, diarrhea or abdominal pain.  GENITOURINARY: No dysuria, hematuria.   ENDOCRINE: No polyuria, nocturia,  HEMATOLOGY: No anemia, easy bruising or bleeding SKIN: No rash or lesion. MUSCULOSKELETAL; left arm cast present Right elbow slightly swollen and pain ful. NEUROLOGIC: No tingling, numbness, weakness.  PSYCHIATRY: No anxiety or depression.   MEDICATIONS AT HOME:   Prior to Admission medications   Medication Sig Start Date End Date Taking? Authorizing Provider  aspirin EC 81 MG tablet Take 81 mg by mouth daily.   Yes Historical Provider, MD  cephALEXin (KEFLEX) 500 MG capsule Take 1 capsule (500 mg total) by mouth 4 (four) times daily. 11/11/15  Yes Phineas Semen, MD  furosemide (LASIX) 20 MG tablet Take 20 mg by mouth every morning. 09/17/15  Yes Historical Provider, MD  KLOR-CON M10 10 MEQ tablet Take 10 mEq by mouth daily. 10/29/15  Yes Historical Provider, MD  levothyroxine (SYNTHROID, LEVOTHROID) 125 MCG tablet Take 125 mcg by mouth daily before breakfast. 11/12/15  Yes Historical Provider, MD  megestrol (MEGACE ES) 625 MG/5ML suspension Take 5 mLs by mouth daily. 11/08/15  Yes Historical Provider, MD  Multiple Vitamin (MULTIVITAMIN) tablet Take 1 tablet by mouth daily.   Yes Historical Provider, MD  oxyCODONE-acetaminophen (ROXICET) 5-325 MG tablet Take 1 tablet by mouth every 6 (six) hours as needed. 10/31/15  Yes Minna Antis, MD  sulfamethoxazole-trimethoprim (BACTRIM DS,SEPTRA DS) 800-160 MG tablet Take 1 tablet by mouth 2 (two) times daily. 11/11/15  Yes Phineas Semen, MD  vitamin B-12 (CYANOCOBALAMIN) 500 MCG tablet Take 500 mcg by mouth daily.   Yes Historical Provider, MD      VITAL SIGNS:  Blood pressure 110/54, pulse 76, temperature 98.5 F (36.9 C), resp. rate 28, SpO2 100 %.  PHYSICAL EXAMINATION:  GENERAL:  82 y.o.-year-old patient lying in the bed with no acute distress.  EYES: Pupils equal, round, reactive to light and accommodation. No scleral icterus. Extraocular muscles intact.  HEENT: Head atraumatic, normocephalic.  Oropharynx and nasopharynx clear.  NECK:  Supple, no jugular venous distention. No thyroid enlargement, no tenderness.  LUNGS: Normal breath sounds bilaterally, no wheezing, rales,rhonchi or crepitation. No use of accessory muscles of respiration.  CARDIOVASCULAR: S1, S2 normal. No murmurs, rubs, or gallops.  ABDOMEN: Soft, nontender, nondistended. Bowel sounds present. No organomegaly or mass.  EXTREMITIES: No pedal edema, cyanosis, or clubbing.  NEUROLOGIC: Cranial nerves II through XII are intact. Muscle strength 5/5 in all extremities. Sensation intact. Gait not checked.  PSYCHIATRIC: The patient is alert and oriented x 3.  SKIN: No obvious rash, lesion, or ulcer.   LABORATORY PANEL:   CBC  Recent Labs Lab 11/16/15 1136  WBC 10.2  HGB 11.8*  HCT 34.9*  PLT 339   ------------------------------------------------------------------------------------------------------------------  Chemistries   Recent Labs Lab 11/16/15 1136  NA 140  K 5.4*  CL 107  CO2 20*  GLUCOSE 99  BUN 43*  CREATININE 4.29*  CALCIUM 9.3  AST 59*  ALT 23  ALKPHOS 68  BILITOT 0.8   ------------------------------------------------------------------------------------------------------------------  Cardiac Enzymes  Recent Labs Lab 11/16/15 1136  TROPONINI 0.07*   ------------------------------------------------------------------------------------------------------------------  RADIOLOGY:  Dg Hand Complete Right  11/16/2015  CLINICAL DATA:  Pt from [place] plaze via EMS. Family reports pt has been falling more frequently. Reports they found him in his room on the floor, unknown how long he'd been down. Fractured R. elbow from previous fall. Pint EXAM: RIGHT HAND - COMPLETE 3+ VIEW COMPARISON:  None. FINDINGS: No evidence of fracture of the carpal or metacarpal bones. Radiocarpal joint is intact. Phalanges are normal. No soft tissue injury. IMPRESSION: No fracture or dislocation. Electronically  Signed   By: Genevive BiStewart  Edmunds M.D.   On: 11/16/2015 12:14    EKG:   Orders placed or performed during the hospital encounter of 11/16/15  . EKG 12-Lead  . EKG 12-Lead    IMPRESSION AND PLAN:    1 .acute on chronic renal failure secondary to recent Bactrim use: Continue IV hydration discontinue Bact10 daily kidney function..hold lasix.  #2 multiple falls with ambulatory difficulties: Physical therapy consult, family and is requesting placement. #3 recent right elbow infection: Slightly tender: Discontinue Bactrim, start Keflex for possible infection. #4.Hypothyroidism;continue Synthroid. Discussed the plan with patient, patient's daughter at bedside. All the records are reviewed and case discussed with ED provider. Management plans discussed with the patient, family and they are in agreement.  CODE STATUS: full  TOTAL TIME TAKING CARE OF THIS PATIENT: 55 minutes.

## 2015-11-16 NOTE — Progress Notes (Signed)
Received to room 144, pt alert oriented aware of surrondings , pt has multiple bruises all over body arms and legs and left hip also has pressure ulcer stage II on rt buttocks , foam dressing applied sacral dressing applied

## 2015-11-16 NOTE — ED Notes (Signed)
Pt from [place] plaze via EMS. Family reports pt has been falling more frequently. Reports they found him in his room on the floor, unknown how long he'd been down. Fractured R.wrist and elbow from previous fall

## 2015-11-17 DIAGNOSIS — L8990 Pressure ulcer of unspecified site, unspecified stage: Secondary | ICD-10-CM | POA: Insufficient documentation

## 2015-11-17 LAB — BASIC METABOLIC PANEL
Anion gap: 7 (ref 5–15)
BUN: 37 mg/dL — ABNORMAL HIGH (ref 6–20)
CHLORIDE: 111 mmol/L (ref 101–111)
CO2: 21 mmol/L — ABNORMAL LOW (ref 22–32)
CREATININE: 3.21 mg/dL — AB (ref 0.61–1.24)
Calcium: 8.2 mg/dL — ABNORMAL LOW (ref 8.9–10.3)
GFR, EST AFRICAN AMERICAN: 17 mL/min — AB (ref >60)
GFR, EST NON AFRICAN AMERICAN: 15 mL/min — AB (ref >60)
Glucose, Bld: 112 mg/dL — ABNORMAL HIGH (ref 65–99)
POTASSIUM: 4.2 mmol/L (ref 3.5–5.1)
Sodium: 139 mmol/L (ref 135–145)

## 2015-11-17 LAB — CBC
HCT: 29.4 % — ABNORMAL LOW (ref 40.0–52.0)
Hemoglobin: 9.8 g/dL — ABNORMAL LOW (ref 13.0–18.0)
MCH: 32.5 pg (ref 26.0–34.0)
MCHC: 33.5 g/dL (ref 32.0–36.0)
MCV: 97.1 fL (ref 80.0–100.0)
PLATELETS: 274 10*3/uL (ref 150–440)
RBC: 3.02 MIL/uL — AB (ref 4.40–5.90)
RDW: 13 % (ref 11.5–14.5)
WBC: 7.2 10*3/uL (ref 3.8–10.6)

## 2015-11-17 LAB — GLUCOSE, CAPILLARY: Glucose-Capillary: 126 mg/dL — ABNORMAL HIGH (ref 65–99)

## 2015-11-17 NOTE — Progress Notes (Addendum)
[facility] [place] at [facility]   PATIENT NAME: Jermaine Mcclain    MR#:  [id number]  DATE OF BIRTH:  May 26, 1915  SUBJECTIVE:  Came in with weakness and fall at home  REVIEW OF SYSTEMS:   Review of Systems  Constitutional: Negative for fever, chills and weight loss.  HENT: Negative for ear discharge, ear pain and nosebleeds.   Eyes: Negative for blurred vision, pain and discharge.  Respiratory: Negative for sputum production, shortness of breath, wheezing and stridor.   Cardiovascular: Negative for chest pain, palpitations, orthopnea and PND.  Gastrointestinal: Negative for nausea, vomiting, abdominal pain and diarrhea.  Genitourinary: Negative for urgency and frequency.  Musculoskeletal: Positive for joint pain and falls. Negative for back pain.  Neurological: Positive for weakness. Negative for sensory change, speech change and focal weakness.  Psychiatric/Behavioral: Negative for depression and hallucinations. The patient is not nervous/anxious.   All other systems reviewed and are negative.  Tolerating Diet:yes Tolerating PT: SNf  DRUG ALLERGIES:  No Known Allergies  VITALS:  Blood pressure 104/45, pulse 75, temperature 98.1 F (36.7 C), temperature source Oral, resp. rate 18, height 5\' 7"  (1.702 m), weight 70.308 kg (155 lb), SpO2 94 %.  PHYSICAL EXAMINATION:   Physical Exam  GENERAL:  82 y.o.-year-old patient lying in the bed with no acute distress.  EYES: Pupils equal, round, reactive to light and accommodation. No scleral icterus. Extraocular muscles intact.  HEENT: Head atraumatic, normocephalic. Oropharynx and nasopharynx clear.  NECK:  Supple, no jugular venous distention. No thyroid enlargement, no tenderness.  LUNGS: Normal breath sounds bilaterally, no wheezing, rales, rhonchi. No use of accessory muscles of respiration.  CARDIOVASCULAR: S1, S2 normal. No murmurs, rubs, or gallops.  ABDOMEN: Soft, nontender, nondistended. Bowel  sounds present. No organomegaly or mass.  EXTREMITIES: No cyanosis, clubbing or edema b/l.   UE cast + NEUROLOGIC: Cranial nerves II through XII are intact. No focal Motor or sensory deficits b/l.   PSYCHIATRIC: The patient is alert and oriented x 3.  SKIN: No obvious rash, lesion, or ulcer.   LABORATORY PANEL:  CBC  Recent Labs Lab 11/17/15 0417  WBC 7.2  HGB 9.8*  HCT 29.4*  PLT 274    Chemistries   Recent Labs Lab 11/16/15 1136 11/17/15 0417  NA 140 139  K 5.4* 4.2  CL 107 111  CO2 20* 21*  GLUCOSE 99 112*  BUN 43* 37*  CREATININE 4.29* 3.21*  CALCIUM 9.3 8.2*  AST 59*  --   ALT 23  --   ALKPHOS 68  --   BILITOT 0.8  --     Cardiac Enzymes  Recent Labs Lab 11/16/15 1136  TROPONINI 0.07*   RADIOLOGY:  Dg Hand Complete Right  11/16/2015  CLINICAL DATA:  Pt from [place] [facility] via EMS. Family reports pt has been falling more frequently. Reports they found him in his room on the floor, unknown how long he'd been down. Fractured R. elbow from previous fall. Pint EXAM: RIGHT HAND - COMPLETE 3+ VIEW COMPARISON:  None. FINDINGS: No evidence of fracture of the carpal or metacarpal bones. Radiocarpal joint is intact. Phalanges are normal. No soft tissue injury. IMPRESSION: No fracture or dislocation. Electronically Signed   By: Genevive BiStewart  Edmunds M.D.   On: 11/16/2015 12:14   ASSESSMENT AND PLAN:  Jermaine Mcclain is a 82 y.o. male with a known history of appetite present, hypertension brought in by the family secondary to multiple falls recently. Patient daughter found him on the  floor this morning, unsure if he was on the floor overnight. Patient lives alone, having multiple falls, recently had seen Dr. Hyacinth Meeker for left wrist fracture, had a cast on fifth of January  1 .acute on chronic renal failure secondary to recent Bactrim use: - Continue IV hydration  -discontinued Bactrim -monitor kidney function. -hold lasix. -creat 4.29---3.21 -baseline creat 1.8-1.95  #2  multiple falls with ambulatory difficulties: Physical therapy consult, family and is requesting placement.  #3 recent right elbow infection: Slightly tender: Discontinue Bactrim -elbow does not look infected. Hold po keflex -no fever and wbc normal  #4.Hypothyroidism;continue Synthroid.  Discussed the plan with patient  Case discussed with Care Management/Social Worker. Management plans discussed with the patient, family and they are in agreement.  CODE STATUS: full code  DVT Prophylaxis: lovenox  TOTAL TIME TAKING CARE OF THIS PATIENT: 30* minutes.  >50% time spent on counselling and coordination of care  POSSIBLE D/C IN 1-2 DAYS, DEPENDING ON CLINICAL CONDITION.  Note: This dictation was prepared with Dragon dictation along with smaller phrase technology. Any transcriptional errors that result from this process are unintentional.  Jacquelin Krajewski M.D on 11/17/2015 at 1:31 PM  Between 7am to 6pm - Pager - [phone]  After 6pm go to [email or web address] - password [email or web address] [facility]  [facility]  Office  [phone]  CC: Primary care physician; Lauro Regulus., MD

## 2015-11-17 NOTE — NC FL2 (Signed)
[place] MEDICAID FL2 LEVEL OF CARE SCREENING TOOL     IDENTIFICATION  Patient Name: Jermaine Mcclain Birthdate: [birth year] Sex: male Admission Date (Current Location): 11/16/2015  [place]   Facility and Address:  [facility], [address], [place], [place] [address]      Provider Number: [id number]  Attending Physician Name and Address:  Enedina FinnerSona Patel, MD  Relative Name and Phone Number:       Current Level of Care: SNF Recommended Level of Care: Skilled Nursing Facility Prior Approval Number:    Date Approved/Denied:   PASRR Number: [id number]  Discharge Plan: SNF    Current Diagnoses: Patient Active Problem List   Diagnosis Date Noted  . Pressure ulcer 11/17/2015  . Acute on chronic renal failure (HCC) 11/16/2015    Orientation RESPIRATION BLADDER Height & Weight    Self, Place  Normal Continent   155 lbs.  BEHAVIORAL SYMPTOMS/MOOD NEUROLOGICAL BOWEL NUTRITION STATUS   (none)  (none) Continent Diet  AMBULATORY STATUS COMMUNICATION OF NEEDS Skin   Limited Assist Verbally Bruising                       Personal Care Assistance Level of Assistance              Functional Limitations Info  Hearing, Sight Sight Info: Impaired Hearing Info: Impaired      SPECIAL CARE FACTORS FREQUENCY  PT (By licensed PT)                    Contractures Contractures Info: Not present    Additional Factors Info  Code Status, Allergies Code Status Info: FULL Allergies Info: nka           Current Medications (11/17/2015):  This is the current hospital active medication list Current Facility-Administered Medications  Medication Dose Route Frequency Provider Last Rate Last Dose  . 0.9 %  sodium chloride infusion   Intravenous Continuous Katha HammingSnehalatha Konidena, MD 100 mL/hr at 11/17/15 0419    . acetaminophen (TYLENOL) tablet 650 mg  650 mg Oral Q6H PRN Katha HammingSnehalatha Konidena, MD   650 mg at  11/17/15 0837   Or  . acetaminophen (TYLENOL) suppository 650 mg  650 mg Rectal Q6H PRN Katha HammingSnehalatha Konidena, MD      . bisacodyl (DULCOLAX) EC tablet 5 mg  5 mg Oral Daily PRN Katha HammingSnehalatha Konidena, MD      . cephALEXin (KEFLEX) capsule 250 mg  250 mg Oral Q24H Katha HammingSnehalatha Konidena, MD   250 mg at 11/16/15 2017  . cyanocobalamin tablet 500 mcg  500 mcg Oral Daily Katha HammingSnehalatha Konidena, MD   500 mcg at 11/17/15 0837  . docusate sodium (COLACE) capsule 100 mg  100 mg Oral BID Cindi CarbonMary M Swayne, RPH   100 mg at 11/17/15 [id number]  . heparin injection 5,000 Units  5,000 Units Subcutaneous 3 times per day Cindi CarbonMary M Swayne, [facility]   5,000 Units at 11/17/15 [id number]  . HYDROcodone-acetaminophen (NORCO/VICODIN) 5-325 MG per tablet 1-2 tablet  1-2 tablet Oral Q4H PRN Katha HammingSnehalatha Konidena, MD   1 tablet at 11/16/15 2250  . levothyroxine (SYNTHROID, LEVOTHROID) tablet 125 mcg  125 mcg Oral QAC breakfast Katha HammingSnehalatha Konidena, MD   125 mcg at 11/17/15 [phone]  . megestrol (MEGACE) 400 MG/10ML suspension 800 mg  800 mg Oral Daily Cindi CarbonMary M Swayne, RPH   800 mg at 11/17/15 [id number]  . multivitamin with minerals tablet 1 tablet  1 tablet Oral  Daily Katha Hamming, MD   1 tablet at 11/17/15 0837  . ondansetron (ZOFRAN) tablet 4 mg  4 mg Oral Q6H PRN Katha Hamming, MD       Or  . ondansetron (ZOFRAN) injection 4 mg  4 mg Intravenous Q6H PRN Katha Hamming, MD         Discharge Medications: Please see discharge summary for a list of discharge medications.  Relevant Imaging Results:  Relevant Lab Results:   Additional Information SS: [id number]  York Spaniel, LCSW

## 2015-11-17 NOTE — Evaluation (Signed)
Physical Therapy Evaluation Patient Details Name: Lashan Gluth MRN: [id number] DOB: 1915-01-18 Today's Date: 11/17/2015   History of Present Illness  Pt here with acute on chronic renal failure.  brought in by the family secondary to multiple falls recently. Patient daughter found him on the floor this morning, unsure if he was on the floor overnight. Patient lives alone, having multiple falls, recently had seen Dr. Hyacinth Meeker for left wrist fracture, had a cast on fifth of January. Patient also was given Bactrim for right elbow infection   Clinical Impression  Pt initially very confident that he would be able to do some walking without an AD, but once he struggled to get up and subsequently needed relatively heavy HHA with his R hand to maintain balance at EOB he realizes he was weaker than expected.  He also becomes very fatigued with this minimal effort and quickly sits down, needs help to get to supine and closes his eyes.  Pt may need cane or even platform walker (cast on L UE 10 days post wrist fx).      Follow Up Recommendations SNF    Equipment Recommendations   (may need platform walker, TBD?)    Recommendations for Other Services       Precautions / Restrictions Precautions Precautions: Fall Restrictions Weight Bearing Restrictions: No      Mobility  Bed Mobility Overal bed mobility: Needs Assistance Bed Mobility: Supine to Sit;Sit to Supine     Supine to sit: Mod assist Sit to supine: Min assist   General bed mobility comments: Pt needing to use PTs hand for assist getting into and out of bed, c/o R elbow pain when trying to push through it  Transfers Overall transfer level: Needs assistance Equipment used: 1 person hand held assist Transfers: Sit to/from Stand Sit to Stand: Mod assist         General transfer comment: Pt attempts to get up on his own and then while holding PTs hand with his R multiple times but is unable to lift himself off bed.  He  is unable to rise w/o mod assist and is unsteady in doing this.  Pt verbalized that he was sure he'd be able to stand w/o assist before trying.   Ambulation/Gait Ambulation/Gait assistance: Min assist Ambulation Distance (Feet): 2 Feet Assistive device: 1 person hand held assist       General Gait Details: Pt able to take 2 small side shuffling steps along EOB holding PT with his R hand.  He is initially confident but then realizes he is much more unsteady than he had believed.  Pt fatigues with this minimal effort and quickly sits back down.  Stairs            Wheelchair Mobility    Modified Rankin (Stroke Patients Only)       Balance                                             Pertinent Vitals/Pain Pain Assessment:  (moderate c/o pain, not rated)    Home Living Family/patient expects to be discharged to:: Skilled nursing facility Living Arrangements: Alone                    Prior Function Level of Independence: Independent         Comments: Pt reports that he is normally  able to do all he needs w/o issue, driving, errands, etc     Hand Dominance        Extremity/Trunk Assessment   Upper Extremity Assessment: Generalized weakness (age appropriate deficits)           Lower Extremity Assessment: Generalized weakness (Pt with functional AROM, has age appropriate weakness)         Communication   Communication: HOH  Cognition Arousal/Alertness: Lethargic Behavior During Therapy: WFL for tasks assessed/performed Overall Cognitive Status: Within Functional Limits for tasks assessed                      General Comments      Exercises        Assessment/Plan    PT Assessment Patient needs continued PT services  PT Diagnosis Difficulty walking;Generalized weakness   PT Problem List Decreased strength;Decreased range of motion;Decreased activity tolerance;Decreased balance;Decreased mobility;Decreased  coordination;Decreased knowledge of use of DME;Decreased safety awareness  PT Treatment Interventions DME instruction;Gait training;Stair training;Functional mobility training;Therapeutic activities;Therapeutic exercise;Balance training;Neuromuscular re-education;Patient/family education   PT Goals (Current goals can be found in the Care Plan section) Acute Rehab PT Goals Patient Stated Goal: "What every ya'll tell me." PT Goal Formulation: With patient Time For Goal Achievement: 12/01/15 Potential to Achieve Goals: Fair    Frequency Min 2X/week   Barriers to discharge        Co-evaluation               End of Session Equipment Utilized During Treatment: Gait belt Activity Tolerance: Patient limited by fatigue Patient left: with bed alarm set;with call bell/phone within reach           Time: 1051-1108 PT Time Calculation (min) (ACUTE ONLY): 17 min   Charges:   PT Evaluation $PT Eval Low Complexity: 1 Procedure     PT G Codes:       Loran SentersGalen Elora Wolter, PT, DPT [phone]  Malachi ProGalen R Angeliki Mates 11/17/2015, 11:51 AM

## 2015-11-17 NOTE — Clinical Social Work Note (Signed)
Clinical Social Work Assessment  Patient Details  Name: Jermaine Mcclain MRN: [id number] Date of Birth: 01/25/1915  Date of referral:  11/17/15               Reason for consult:  Facility Placement                Permission sought to share information with:  Family Supports, Chartered certified accountant granted to share information::  Yes, Verbal Permission Granted  Name::        Agency::     Relationship::     Contact Information:     Housing/Transportation Living arrangements for the past 2 months:  Single Family Home Source of Information:  Patient, Adult Children Patient Interpreter Needed:  None Criminal Activity/Legal Involvement Pertinent to Current Situation/Hospitalization:  No - Comment as needed Significant Relationships:  Adult Children Lives with:  Self Do you feel safe going back to the place where you live?    Need for family participation in patient care:     Care giving concerns:  Patient resides alone at his home.   Social Worker assessment / plan:  CSW received consults for STR on admission. PT is pending and has not yet assessed patient. CSW briefly met with patient this morning and he stated he would be willing to go for rehab only if it was free. Patient gave permission for me to call his daughter: Langley Gauss: [phone]. CSW contacted Langley Gauss and she informed CSW that patient lives alone and that he has dementia. Langley Gauss states she is patient's POA, but is aware that we will require the paperwork. Langley Gauss stated that her sister had made arrangements for hospice to follow patient in his home, but Langley Gauss states she does not know the agency that was arranged. Patient has fallen multiple times over the past few days and Langley Gauss is stating that the family more than likely will want rehab at discharge from the hospital. CSW explained that insurance is not able to pay for rehab and for hospice in a facility at the same time. Langley Gauss stated that she is  leaning toward rehab. Bedsearch to be initiated. Awaiting PT.   Employment status:  Retired Nurse, adult PT Recommendations:  Not assessed at this time Information / Referral to community resources:     Patient/Family's Response to care:  Patient and daughter expressed appreciation for CSW assistance.  Patient/Family's Understanding of and Emotional Response to Diagnosis, Current Treatment, and Prognosis:  Both patient and daughter are in agreement with rehab if recommended by PT.   Emotional Assessment Appearance:  Appears stated age Attitude/Demeanor/Rapport:   (pleasant and cooperative) Affect (typically observed):  Calm Orientation:  Oriented to Self, Oriented to Place, Fluctuating Orientation (Suspected and/or reported Sundowners) Alcohol / Substance use:  Not Applicable Psych involvement (Current and /or in the community):  No (Comment)  Discharge Needs  Concerns to be addressed:  Care Coordination Readmission within the last 30 days:  No Current discharge risk:  None Barriers to Discharge:  No Barriers Identified   Shela Leff, LCSW 11/17/2015, 11:57 AM

## 2015-11-18 LAB — CREATININE, SERUM
Creatinine, Ser: 2.14 mg/dL — ABNORMAL HIGH (ref 0.61–1.24)
GFR calc non Af Amer: 24 mL/min — ABNORMAL LOW (ref >60)
GFR, EST AFRICAN AMERICAN: 28 mL/min — AB (ref >60)

## 2015-11-18 LAB — GLUCOSE, CAPILLARY: GLUCOSE-CAPILLARY: 104 mg/dL — AB (ref 65–99)

## 2015-11-18 NOTE — Care Management Important Message (Signed)
Important Message  Patient Details  Name: Jermaine Mcclain MRN: [id number] Date of Birth: 24-May-1915   Medicare Important Message Given:  Yes    Olegario MessierKathy A [name] 11/18/2015, 9:50 AM

## 2015-11-18 NOTE — Progress Notes (Signed)
Clinical Child psychotherapistocial Worker (CSW) spoke with patient's daughter Angelique BlonderDenise via telephone. CSW presented bed offers. Daughter chose [facility]. Kim [facility] is aware of [facility] bed offer. CSW explained to daughter that patient can't have hospice services and rehab at the same time. Daughter verbalized her understanding and reported that she would take care of discharging hospice services. [facility] lia[facility] is aware of above. CSW will continue to follow and assist as needed.   Jetta LoutBailey Morgan, LCSWA [phone]

## 2015-11-18 NOTE — Care Management (Signed)
Patient is open to [facility] confirmed by Clydie BraunKaren.

## 2015-11-18 NOTE — Progress Notes (Signed)
[facility] [place] [facility]   PATIENT NAME: Jermaine BaronGeorge Mcclain    MR#:  [id number]  DATE OF BIRTH:  03/17/1915  SUBJECTIVE:  Came in with weakness and fall at home. Out in the chair. Denies any complaints  REVIEW OF SYSTEMS:   Review of Systems  Constitutional: Negative for fever, chills and weight loss.  HENT: Negative for ear discharge, ear pain and nosebleeds.   Eyes: Negative for blurred vision, pain and discharge.  Respiratory: Negative for sputum production, shortness of breath, wheezing and stridor.   Cardiovascular: Negative for chest pain, palpitations, orthopnea and PND.  Gastrointestinal: Negative for nausea, vomiting, abdominal pain and diarrhea.  Genitourinary: Negative for urgency and frequency.  Musculoskeletal: Positive for joint pain and falls. Negative for back pain.  Neurological: Positive for weakness. Negative for sensory change, speech change and focal weakness.  Psychiatric/Behavioral: Negative for depression and hallucinations. The patient is not nervous/anxious.   All other systems reviewed and are negative.  Tolerating Diet:yes Tolerating PT: SNf  DRUG ALLERGIES:  No Known Allergies  VITALS:  Blood pressure 104/43, pulse 79, temperature 97.5 F (36.4 C), temperature source Oral, resp. rate 17, height 5\' 7"  (1.702 m), weight 68.493 kg (151 lb), SpO2 97 %.  PHYSICAL EXAMINATION:   Physical Exam  GENERAL:  82 y.o.-year-old patient lying in the bed with no acute distress.  EYES: Pupils equal, round, reactive to light and accommodation. No scleral icterus. Extraocular muscles intact.  HEENT: Head atraumatic, normocephalic. Oropharynx and nasopharynx clear.  NECK:  Supple, no jugular venous distention. No thyroid enlargement, no tenderness.  LUNGS: Normal breath sounds bilaterally, no wheezing, rales, rhonchi. No use of accessory muscles of respiration.  CARDIOVASCULAR: S1, S2 normal. No murmurs, rubs, or gallops.  ABDOMEN:  Soft, nontender, nondistended. Bowel sounds present. No organomegaly or mass.  EXTREMITIES: No cyanosis, clubbing or edema b/l.   UE cast + NEUROLOGIC: Cranial nerves II through XII are intact. No focal Motor or sensory deficits b/l.   PSYCHIATRIC: The patient is alert and oriented x 3.  SKIN: No obvious rash, lesion, or ulcer.   LABORATORY PANEL:  CBC  Recent Labs Lab 11/17/15 0417  WBC 7.2  HGB 9.8*  HCT 29.4*  PLT 274    Chemistries   Recent Labs Lab 11/16/15 1136 11/17/15 0417 11/18/15 1309  NA 140 139  --   K 5.4* 4.2  --   CL 107 111  --   CO2 20* 21*  --   GLUCOSE 99 112*  --   BUN 43* 37*  --   CREATININE 4.29* 3.21* 2.14*  CALCIUM 9.3 8.2*  --   AST 59*  --   --   ALT 23  --   --   ALKPHOS 68  --   --   BILITOT 0.8  --   --     Cardiac Enzymes  Recent Labs Lab 11/16/15 1136  TROPONINI 0.07*   RADIOLOGY:  No results found. ASSESSMENT AND PLAN:  Jermaine BaronGeorge Truss is a 82 y.o. male with a known history of appetite present, hypertension brought in by the family secondary to multiple falls recently. Patient daughter found him on the floor this morning, unsure if he was on the floor overnight. Patient lives alone, having multiple falls, recently had seen Dr. Hyacinth MeekerMiller for left wrist fracture, had a cast on fifth of January  1 .acute on chronic renal failure secondary to recent Bactrim use: - Continue IV hydration  -discontinued Bactrim -monitor kidney  function. -hold lasix. -creat 4.29---3.21--2.14 -baseline creat 1.8-1.95  #2 multiple falls with ambulatory difficulties: Physical therapy consult, family and is requesting placement.  #3 recent right elbow infection: Slightly tender: Discontinue Bactrim -elbow does not look infected. Hold po keflex -no fever and wbc normal  #4.Hypothyroidism;continue Synthroid.   To rehab in am if continues to improve  Case discussed with Care Management/Social Worker. Management plans discussed with the patient,  family and they are in agreement.  CODE STATUS: full code  DVT Prophylaxis: lovenox  TOTAL TIME TAKING CARE OF THIS PATIENT: 30* minutes.  >50% time spent on counselling and coordination of care  POSSIBLE D/C IN 1-2 DAYS, DEPENDING ON CLINICAL CONDITION.  Note: This dictation was prepared with Dragon dictation along with smaller phrase technology. Any transcriptional errors that result from this process are unintentional.  Kebrina Friend M.D on 11/18/2015 at 5:48 PM  Between 7am to 6pm - Pager - [phone]  After 6pm go to [email or web address] - password [email or web address] [facility]  [place] [facility]  Office  [phone]  CC: Primary care physician; Lauro Regulus., MD

## 2015-11-18 NOTE — Progress Notes (Signed)
SNF, Non-Emergent EMS Transport, and Home Health Benefits:  Number called: [phone] Rep: Lorriane Shire Reference Number: [id number]  [employer] Medicare Complete HMO Plan One active as of 11/03/15 with no deductible.  Out of pocket max is $4900, of which $43.85 met so far.  In-network SNF: $0 copay for days 1-20, a $160 daily copay for days 21-51, and a $0 copay for days 52-100.  Once out of pocket is reached, patient covered at 100% for remainder of 100 day benefit period.  $0 copay for professional fees and 3 day hospital stay is not required.  Josem Kaufmann is required: [phone].    Non-emergent EMS transport: $250 copay for each one way medically necessary, Medicare covered trip.  Josem Kaufmann is required: [phone].  Home Health: $0 copay for home health services.  20% co-insurance for DME and certain medical supplies.  Must be homebound.  RN and Aide hours should be less than 8 hrs per day and 35 hours per week to be covered.  Josem Kaufmann is not required for days 1-60, though required for days 61+: [phone].

## 2015-11-18 NOTE — Progress Notes (Signed)
Visit made. Patient is currently followed by [facility]ospice and [place] at his home with a  Hospice diagnosis of protein calorie malnutrition. He was admitted to [facility] on 1/14 for treatment acute on chronic renal failure. He has received IV fluids and Iv antibiotics. Writer met in the room with patient and his daughter and Gayleen Orem. Patient was alert able to voice needs. Supper tray set up, patient able to feed himself. Per conversation with Langley Gauss and CSW Blima Rich plan is for patient to discharge to [facility] for short term rehab. Langley Gauss is aware that patient will revoke his hospice medicare benefit for this plan. Revocation form signed by Langley Gauss. Plan is for possible discharge to [facility] tomorrow. Will continue to follow through final disposition and update hospice team. Thank you. Flo Shanks RN, BSN, [facility] [place], hospital Liaison [phone] c

## 2015-11-18 NOTE — Progress Notes (Signed)
Physical Therapy Treatment Patient Details Name: Vestal Markin MRN: [id number] DOB: 21-May-1915 Today's Date: 11/18/2015    History of Present Illness Pt here with acute on chronic renal failure.  brought in by the family secondary to multiple falls recently. Patient daughter found him on the floor this morning, unsure if he was on the floor overnight. Patient lives alone, having multiple falls, recently had seen Dr. Hyacinth Meeker for left wrist fracture, had a cast on fifth of January. Patient also was given Bactrim for right elbow infection     PT Comments    Pt with progressing with PT this session requiring decreased physical assistance with bed mobility and transfers and able to ambulate 8 feet with PRW and Min A.  Pt impulsive, needs verbal cues to slow down and wait for instruction.  Pt continues with decreased strength especially in core muscles which is limiting his bed mobility.  Continue with POC.   Follow Up Recommendations  SNF     Equipment Recommendations   (platform RW)    Recommendations for Other Services       Precautions / Restrictions Precautions Precautions: Fall Restrictions Weight Bearing Restrictions: No    Mobility  Bed Mobility Overal bed mobility: Needs Assistance Bed Mobility: Supine to Sit     Supine to sit: Min assist;HOB elevated     General bed mobility comments: "Stand here and let me grab your hand."  Pt with difficulty using bed rail for transfer, preferring to use therapist's hand to assist elevating trunk; able to throw legs off bed in one swoooping motion.  Transfers Overall transfer level: Needs assistance Equipment used: Left platform walker Transfers: Sit to/from Stand Sit to Stand: Min guard         General transfer comment: Pt able to stand on second attempt with bracing of B LE"s against bed and pushing down on RW.  Ambulation/Gait Ambulation/Gait assistance: Min assist Ambulation Distance (Feet): 8 Feet Assistive  device: Left platform walker       General Gait Details: Amb around bed to recliner with fair balance; able to negotiate tight turns without decline in balance; impulsive with decreased awareness of surroundings (IV line).   Stairs            Wheelchair Mobility    Modified Rankin (Stroke Patients Only)       Balance Overall balance assessment: Needs assistance Sitting-balance support: Feet supported;Bilateral upper extremity supported     Postural control: Posterior lean Standing balance support: Bilateral upper extremity supported Standing balance-Leahy Scale: Fair                      Cognition Arousal/Alertness: Awake/alert Behavior During Therapy: WFL for tasks assessed/performed Overall Cognitive Status: Within Functional Limits for tasks assessed                      Exercises Other Exercises Other Exercises: Supine: B LE therex including AP, HS, QS, and ABD/ ADD, and bicycles x 20 reps each    General Comments        Pertinent Vitals/Pain Pain Assessment: No/denies pain    Home Living                      Prior Function            PT Goals (current goals can now be found in the care plan section) Acute Rehab PT Goals Patient Stated Goal: "What every ya'll tell me."  PT Goal Formulation: With patient Time For Goal Achievement: 12/01/15 Potential to Achieve Goals: Fair    Frequency  Min 2X/week    PT Plan Current plan remains appropriate    Co-evaluation             End of Session Equipment Utilized During Treatment: Gait belt Activity Tolerance: Patient limited by fatigue Patient left: in chair;with chair alarm set     Time: 1033-1100 PT Time Calculation (min) (ACUTE ONLY): 27 min  Charges:  $Gait Training: 8-22 mins $Therapeutic Exercise: 8-22 mins                    G Codes:      Rikia Sukhu A Demetress Tift 11/18/2015, 11:07 AM

## 2015-11-18 NOTE — Progress Notes (Signed)
Initial Nutrition Assessment   INTERVENTION:   Meals and Snacks: Cater to patient preferences Medical Food Supplement Therapy: will recommend Ensure Enlive po TID, each supplement provides 350 kcal and 20 grams of protein   NUTRITION DIAGNOSIS:   Inadequate oral intake related to poor appetite as evidenced by per patient/family report.  GOAL:   Patient will meet greater than or equal to 90% of their needs  MONITOR:    (Energy Intake, Electrolyte and renal Profile, Anthropometrics, Digestive system, skin)  REASON FOR ASSESSMENT:   Malnutrition Screening Tool, Diagnosis (Pressure Ulcer)    ASSESSMENT:   Pt admitted from home with acute on chronic renal failure, with multiple falls. Pt with cast placed on left wrist on 11/07/2015 for wrist fracture. Pt also noted to have stage II buttock pressure ulcer.  Past Medical History  Diagnosis Date  . Hypertension   . CHF (congestive heart failure) (HCC)   . Thyroid disease   . Arm fracture left    Diet Order:  Diet 2 gram sodium Room service appropriate?: Yes with Assist; Fluid consistency:: Thin    Current Nutrition: Pt reports eating eggs and home fries for breakfast this am. Pt drinking Coca-Cola on visit.  Food/Nutrition-Related History: Pt reports good appetite PTA. Pt reports eating biscuits and gravy most mornings. For lunch and dinner pt reports eating 'whatever I have' and 'whenever I need to eat.' Pt reports going out for most meals. RD notes pt from home lives alone and states he drives himself to get something to eat. Per RN pt daughter reported this am, that pt has been eating more here than he was eating at home PTA. Recorded po intake 35-60% of meals yesterday.    Scheduled Medications:  . cephALEXin  250 mg Oral Q24H  . cyanocobalamin  500 mcg Oral Daily  . docusate sodium  100 mg Oral BID  . heparin  5,000 Units Subcutaneous 3 times per day  . levothyroxine  125 mcg Oral QAC breakfast  . megestrol  800 mg Oral  Daily  . multivitamin with minerals  1 tablet Oral Daily    Continuous Medications:  . sodium chloride 100 mL/hr at 11/18/15 0819     Electrolyte/Renal Profile and Glucose Profile:   Recent Labs Lab 11/16/15 1136 11/17/15 0417  NA 140 139  K 5.4* 4.2  CL 107 111  CO2 20* 21*  BUN 43* 37*  CREATININE 4.29* 3.21*  CALCIUM 9.3 8.2*  GLUCOSE 99 112*   Protein Profile:  Recent Labs Lab 11/16/15 1136  ALBUMIN 3.1*    Gastrointestinal Profile: Last BM: 11/17/2015   Nutrition-Focused Physical Exam Findings:  Unable to complete Nutrition-Focused physical exam at this time.    Weight Change: Pt reports UBW of 150lbs. RD notes 151lbs on admission and one week ago, however 161lbs a little over 2 weeks ago (6% weight loss possibly)    Skin:   (Stage II Buttock pressure ulcer)   Height:   Ht Readings from Last 1 Encounters:  11/16/15 5\' 7"  (1.702 m)    Weight:   Wt Readings from Last 1 Encounters:  11/18/15 151 lb (68.493 kg)   Wt Readings from Last 10 Encounters:  11/18/15 151 lb (68.493 kg)  11/11/15 151 lb (68.493 kg)  10/31/15 161 lb (73.029 kg)    BMI:  Body mass index is 23.64 kg/(m^2).  Estimated Nutritional Needs:   Kcal:  BEE: 1258kcals, TEE: (IF 1.1-1.3)(AF 1.2) 1661-1963kcals  Protein:  82-102g protein (1.2-1.5g/kg)  Fluid:  1713-204755mL  of fluid (25-1mL/kg)  EDUCATION NEEDS:   No education needs identified at this time   HIGH Care Level   Leda Quail, RD, LDN Pager [phone] Weekend/On-Call Pager [phone]

## 2015-11-18 NOTE — Care Management (Addendum)
Message left for patient's daughter Deniece to call RNCM to discuss discharge planning. Patient was sleeping with hands moving under the covers on his chest. Clydie BraunKaren with [facility] is aware of patient admission. Family concerns with "sundowning and patient being alone". Lists of private pay PCS and [facility] services left at patient's bedside as there was no family present. RN. Judie PetitM will continue to follow. SNF is not covered for custodial type need. Deniece called this RNCM back. She states patient has fallen 4 times and "both of his arms are broken from the fall- one arm is casted".  Fredric MareBailey CSW will talk to her about SNF.

## 2015-11-19 ENCOUNTER — Encounter
Admission: RE | Admit: 2015-11-19 | Discharge: 2015-11-19 | Disposition: A | Discharge status: Home / Self Care | Source: Ambulatory Visit | Attending: Internal Medicine | Admitting: Internal Medicine

## 2015-11-19 DIAGNOSIS — N189 Chronic kidney disease, unspecified: Secondary | ICD-10-CM | POA: Insufficient documentation

## 2015-11-19 DIAGNOSIS — D649 Anemia, unspecified: Secondary | ICD-10-CM | POA: Insufficient documentation

## 2015-11-19 LAB — GLUCOSE, CAPILLARY: Glucose-Capillary: 98 mg/dL (ref 65–99)

## 2015-11-19 LAB — CREATININE, SERUM
Creatinine, Ser: 2.11 mg/dL — ABNORMAL HIGH (ref 0.61–1.24)
GFR calc Af Amer: 28 mL/min — ABNORMAL LOW (ref >60)
GFR, EST NON AFRICAN AMERICAN: 24 mL/min — AB (ref >60)

## 2015-11-19 MED ORDER — HALOPERIDOL LACTATE 5 MG/ML IJ SOLN
5.0000 mg | Freq: Once | INTRAMUSCULAR | Status: AC
  Administered 2015-11-19: 5 mg via INTRAVENOUS
  Filled 2015-11-19: qty 1

## 2015-11-19 MED ORDER — CEPHALEXIN 250 MG PO CAPS: 250.0000 mg | ORAL_CAPSULE | Freq: Two times a day (BID) | ORAL | Status: DC

## 2015-11-19 NOTE — Discharge Summary (Signed)
[facility] [place] at [facility]   PATIENT NAME: Jermaine Mcclain    MR#:  [id number]  DATE OF BIRTH:  1915/04/14  DATE OF ADMISSION:  11/16/2015 ADMITTING PHYSICIAN: Katha Hamming, MD  DATE OF DISCHARGE: 11/19/15  PRIMARY CARE PHYSICIAN: Lauro Regulus., MD    ADMISSION DIAGNOSIS:  Elevated CK [R74.8] Elevated troponin I level [R79.89] Fall, subsequent encounter [W19.XXXD] Acute renal failure, unspecified acute renal failure type (HCC) [N17.9]  DISCHARGE DIAGNOSIS:  Acute on chronic renal failure due to dehydration and meds(bactrim, lasix)-improved CKD-III Weakness with falls HTN  SECONDARY DIAGNOSIS:   Past Medical History  Diagnosis Date  . Hypertension   . CHF (congestive heart failure) (HCC)   . Thyroid disease   . Arm fracture left    HOSPITAL COURSE:  Jermaine Mcclain is a 82 y.o. male with a known history of appetite present, hypertension brought in by the family secondary to multiple falls recently. Patient daughter found him on the floor this morning, unsure if he was on the floor overnight. Patient lives alone, having multiple falls, recently had seen Dr. Hyacinth Meeker for left wrist fracture, had a cast on fifth of January  1 .acute on chronic renal failure secondary to recent Bactrim use: - recieved IV hydration  -discontinued Bactrim -monitor kidney function. -hold lasix and k-dur (dter informed about it) -creat 4.29---3.21--2.14--2.11 -baseline creat 1.8-1.95  #2 multiple falls with ambulatory difficulties: Physical therapy consult, family and is requesting placement. To [facility] today  #3 recent left elbow infection: Slightly tender: Discontinue Bactrim -elbow does not look infected. Po keflex 3 more days -no fever and wbc normal  #4.Hypothyroidism;continue Synthroid.  overall imprved d/c to rehab  D/w dter in the room and agreeable CONSULTS OBTAINED:     DRUG ALLERGIES:  No Known Allergies  DISCHARGE  MEDICATIONS:   Current Discharge Medication List    CONTINUE these medications which have CHANGED   Details  cephALEXin (KEFLEX) 250 MG capsule Take 1 capsule (250 mg total) by mouth 2 (two) times daily. Qty: 6 capsule, Refills: 0      CONTINUE these medications which have NOT CHANGED   Details  aspirin EC 81 MG tablet Take 81 mg by mouth daily.    levothyroxine (SYNTHROID, LEVOTHROID) 125 MCG tablet Take 125 mcg by mouth daily before breakfast.    megestrol (MEGACE ES) 625 MG/5ML suspension Take 5 mLs by mouth daily.    Multiple Vitamin (MULTIVITAMIN) tablet Take 1 tablet by mouth daily.    oxyCODONE-acetaminophen (ROXICET) 5-325 MG tablet Take 1 tablet by mouth every 6 (six) hours as needed. Qty: 20 tablet, Refills: 0    vitamin B-12 (CYANOCOBALAMIN) 500 MCG tablet Take 500 mcg by mouth daily.      STOP taking these medications     furosemide (LASIX) 20 MG tablet      KLOR-CON M10 10 MEQ tablet      sulfamethoxazole-trimethoprim (BACTRIM DS,SEPTRA DS) 800-160 MG tablet         If you experience worsening of your admission symptoms, develop shortness of breath, life threatening emergency, suicidal or homicidal thoughts you must seek medical attention immediately by calling 911 or calling your MD immediately  if symptoms less severe.  You Must read complete instructions/literature along with all the possible adverse reactions/side effects for all the Medicines you take and that have been prescribed to you. Take any new Medicines after you have completely understood and accept all the possible adverse reactions/side effects.   Please note  You  were cared for by a hospitalist during your hospital stay. If you have any questions about your discharge medications or the care you received while you were in the hospital after you are discharged, you can call the unit and asked to speak with the hospitalist on call if the hospitalist that took care of you is not available. Once you  are discharged, your primary care physician will handle any further medical issues. Please note that NO REFILLS for any discharge medications will be authorized once you are discharged, as it is imperative that you return to your primary care physician (or establish a relationship with a primary care physician if you do not have one) for your aftercare needs so that they can reassess your need for medications and monitor your lab values. Today   SUBJECTIVE   Feels ok  VITAL SIGNS:  Blood pressure 123/55, pulse 85, temperature 98.1 F (36.7 C), temperature source Oral, resp. rate 18, height  (1.702 m), weight 74.98 kg (165 lb 4.8 oz), SpO2 98 %.  I/O:    Intake/Output Summary (Last 24 hours) at 11/19/15 1050 Last data filed at 11/19/15 0931  Gross per 24 hour  Intake   2530 ml  Output    600 ml  Net   1930 ml    PHYSICAL EXAMINATION:  GENERAL:  82 y.o.-year-old patient lying in the bed with no acute distress.  EYES: Pupils equal, round, reactive to light and accommodation. No scleral icterus. Extraocular muscles intact.  HEENT: Head atraumatic, normocephalic. Oropharynx and nasopharynx clear.  NECK:  Supple, no jugular venous distention. No thyroid enlargement, no tenderness.  LUNGS: Normal breath sounds bilaterally, no wheezing, rales,rhonchi or crepitation. No use of accessory muscles of respiration.  CARDIOVASCULAR: S1, S2 normal. No murmurs, rubs, or gallops.  ABDOMEN: Soft, non-tender, non-distended. Bowel sounds present. No organomegaly or mass.  EXTREMITIES: No pedal edema, cyanosis, or clubbing. Left UE cast + NEUROLOGIC: Cranial nerves II through XII are intact. Muscle strength 5/5 in all extremities. Sensation intact. Gait not checked.  PSYCHIATRIC: The patient is alert and oriented x 2 SKIN: No obvious rash, lesion, or ulcer.   DATA REVIEW:   CBC   Recent Labs Lab 11/17/15 0417  WBC 7.2  HGB 9.8*  HCT 29.4*  PLT 274    Chemistries   Recent Labs Lab  11/16/15 1136 11/17/15 0417  11/19/15 0503  NA 140 139  --   --   K 5.4* 4.2  --   --   CL 107 111  --   --   CO2 20* 21*  --   --   GLUCOSE 99 112*  --   --   BUN 43* 37*  --   --   CREATININE 4.29* 3.21*  < > 2.11*  CALCIUM 9.3 8.2*  --   --   AST 59*  --   --   --   ALT 23  --   --   --   ALKPHOS 68  --   --   --   BILITOT 0.8  --   --   --   < > = values in this interval not displayed.  Microbiology Results   No results found for this or any previous visit (from the past 240 hour(s)).  RADIOLOGY:  No results found.   Management plans discussed with the patient, family and they are in agreement.  CODE STATUS:     Code Status Orders  Start     Ordered   11/16/15 1432  Full code   Continuous     11/16/15 1436    Code Status History    Date Active Date Inactive Code Status Order ID Comments User Context   This patient has a current code status but no historical code status.    Advance Directive Documentation        Most Recent Value   Type of Advance Directive  Healthcare Power of Attorney   Pre-existing out of facility DNR order (yellow form or pink MOST form)     "MOST" Form in Place?        TOTAL TIME TAKING CARE OF THIS PATIENT: 40 minutes.    Kionna Brier M.D on 11/19/2015 at 10:50 AM  Between 7am to 6pm - Pager - [phone] After 6pm go to [email or web address] - password [email or web address] [facility]  [place] [facility]  Office  [phone]  CC: Primary care physician; Lauro Regulus., MD

## 2015-11-19 NOTE — Progress Notes (Addendum)
Patient is medically stable for D/C to [facility] today. Per Kim admissions coordinator at [facility] patient is going to room 215-A. RN will call report at [phone] and arrange EMS for transport. Clinical Child psychotherapist (CSW) sent D/C Summary, FL2 and D/C packet to [employer] via [employer]. CSW attempted to call patient's daughter Angelique Blonder however she did not answer and a voicemail was left. Patient's daughter Please reconsult if future social work needs arise. CSW signing off.   Patient's daughter was at bedside and aware of above. Daughter is agreeable to signing [facility] admissions paper work today.   Jetta Lout, LCSW [phone]

## 2015-11-19 NOTE — Clinical Social Work Placement (Signed)
   CLINICAL SOCIAL WORK PLACEMENT  NOTE  Date:  11/19/2015  Patient Details  Name: Jermaine Mcclain MRN: [id number] Date of Birth: 04-Jun-1915  Clinical Social Work is seeking post-discharge placement for this patient at the Skilled  Nursing Facility level of care (*CSW will initial, date and re-position this form in  chart as items are completed):  Yes   Patient/family provided with  Clinical Social Work Department's list of facilities offering this level of care within the geographic area requested by the patient (or if unable, by the patient's family).  Yes   Patient/family informed of their freedom to choose among providers that offer the needed level of care, that participate in Medicare, Medicaid or managed care program needed by the patient, have an available bed and are willing to accept the patient.  Yes   Patient/family informed of 's ownership interest in [facility] and [facility], as well as of the fact that they are under no obligation to receive care at these facilities.  PASRR submitted to EDS on 11/17/15     PASRR number received on 11/17/15     Existing PASRR number confirmed on       FL2 transmitted to all facilities in geographic area requested by pt/family on 11/17/15     FL2 transmitted to all facilities within larger geographic area on       Patient informed that his/her managed care company has contracts with or will negotiate with certain facilities, including the following:        Yes   Patient/family informed of bed offers received.  Patient chooses bed at  [facility] )     Physician recommends and patient chooses bed at      Patient to be transferred to  [facility] ) on 11/19/15.  Patient to be transferred to facility by  [name] )     Patient family notified on 11/19/15 of transfer.  Name of family member notified:   (CSW left daugther Denise a Engineer, technical sales. )     PHYSICIAN        Additional Comment:    _______________________________________________ [name], LCSW 11/19/2015, 11:33 AM

## 2015-11-19 NOTE — Progress Notes (Signed)
Visit made. Patient seen lying in bed, alert and interactive with Clinical research associate. Daughter Angelique Blonder at bedside. Attending physician Dr. Allena Katz in during visit, plan is for discharge today to [facility] for short term rehab. Revocation of hospice benefit form signed. Hospice team updated. Discharge summary faxed to triage. Dayna Barker RN, BSN, [facility] [place], [facility] [phone] c

## 2015-11-19 NOTE — Progress Notes (Signed)
Spoke with Sedalia Muta, [facility] rep at [phone], to notify of non-emergent EMS transport.  Auth notification reference given as [id number].   Service date range good from 11/19/15 - 02/17/16.   Gap exception requested to determine if services can be considered at an in-network level.

## 2015-11-20 LAB — GLUCOSE, CAPILLARY: Glucose-Capillary: 104 mg/dL — ABNORMAL HIGH (ref 65–99)

## 2015-11-26 DIAGNOSIS — D649 Anemia, unspecified: Secondary | ICD-10-CM | POA: Diagnosis not present

## 2015-11-26 DIAGNOSIS — N189 Chronic kidney disease, unspecified: Secondary | ICD-10-CM | POA: Diagnosis present

## 2015-11-26 LAB — COMPREHENSIVE METABOLIC PANEL
ALK PHOS: 64 U/L (ref 38–126)
ALT: 41 U/L (ref 17–63)
ANION GAP: 5 (ref 5–15)
AST: 27 U/L (ref 15–41)
Albumin: 2.8 g/dL — ABNORMAL LOW (ref 3.5–5.0)
BILIRUBIN TOTAL: 0.2 mg/dL — AB (ref 0.3–1.2)
BUN: 34 mg/dL — ABNORMAL HIGH (ref 6–20)
CALCIUM: 8.9 mg/dL (ref 8.9–10.3)
CO2: 31 mmol/L (ref 22–32)
CREATININE: 1.76 mg/dL — AB (ref 0.61–1.24)
Chloride: 107 mmol/L (ref 101–111)
GFR, EST AFRICAN AMERICAN: 35 mL/min — AB (ref >60)
GFR, EST NON AFRICAN AMERICAN: 30 mL/min — AB (ref >60)
Glucose, Bld: 96 mg/dL (ref 65–99)
Potassium: 4.9 mmol/L (ref 3.5–5.1)
SODIUM: 143 mmol/L (ref 135–145)
TOTAL PROTEIN: 6.8 g/dL (ref 6.5–8.1)

## 2015-11-26 LAB — CBC WITH DIFFERENTIAL/PLATELET
Basophils Absolute: 0 10*3/uL (ref 0–0.1)
Basophils Relative: 0 %
Eosinophils Absolute: 0.3 10*3/uL (ref 0–0.7)
Eosinophils Relative: 3 %
HEMATOCRIT: 32.5 % — AB (ref 40.0–52.0)
HEMOGLOBIN: 10.8 g/dL — AB (ref 13.0–18.0)
LYMPHS ABS: 0.9 10*3/uL — AB (ref 1.0–3.6)
LYMPHS PCT: 12 %
MCH: 33 pg (ref 26.0–34.0)
MCHC: 33.3 g/dL (ref 32.0–36.0)
MCV: 99.3 fL (ref 80.0–100.0)
MONO ABS: 0.4 10*3/uL (ref 0.2–1.0)
MONOS PCT: 5 %
NEUTROS ABS: 6.5 10*3/uL (ref 1.4–6.5)
NEUTROS PCT: 80 %
Platelets: 293 10*3/uL (ref 150–440)
RBC: 3.28 MIL/uL — ABNORMAL LOW (ref 4.40–5.90)
RDW: 13.8 % (ref 11.5–14.5)
WBC: 8.2 10*3/uL (ref 3.8–10.6)

## 2015-11-26 LAB — TSH: TSH: 3.471 u[IU]/mL (ref 0.350–4.500)

## 2015-12-04 ENCOUNTER — Inpatient Hospital Stay
Admission: EM | Admit: 2015-12-04 | Discharge: 2015-12-08 | DRG: 871 | Disposition: A | Discharge status: Hospice | Payer: Medicare Other | Attending: Internal Medicine | Admitting: Internal Medicine

## 2015-12-04 ENCOUNTER — Encounter
Admission: RE | Admit: 2015-12-04 | Discharge: 2015-12-04 | Disposition: A | Discharge status: Home / Self Care | Payer: Medicare Other | Source: Ambulatory Visit | Attending: Internal Medicine | Admitting: Internal Medicine

## 2015-12-04 ENCOUNTER — Emergency Department: Payer: Medicare Other

## 2015-12-04 ENCOUNTER — Encounter: Payer: Self-pay | Admitting: Emergency Medicine

## 2015-12-04 DIAGNOSIS — F0390 Unspecified dementia without behavioral disturbance: Secondary | ICD-10-CM | POA: Diagnosis present

## 2015-12-04 DIAGNOSIS — Z66 Do not resuscitate: Secondary | ICD-10-CM | POA: Diagnosis present

## 2015-12-04 DIAGNOSIS — I130 Hypertensive heart and chronic kidney disease with heart failure and stage 1 through stage 4 chronic kidney disease, or unspecified chronic kidney disease: Secondary | ICD-10-CM | POA: Diagnosis present

## 2015-12-04 DIAGNOSIS — I509 Heart failure, unspecified: Secondary | ICD-10-CM | POA: Diagnosis present

## 2015-12-04 DIAGNOSIS — J9601 Acute respiratory failure with hypoxia: Secondary | ICD-10-CM | POA: Diagnosis present

## 2015-12-04 DIAGNOSIS — J189 Pneumonia, unspecified organism: Secondary | ICD-10-CM | POA: Diagnosis present

## 2015-12-04 DIAGNOSIS — Y95 Nosocomial condition: Secondary | ICD-10-CM | POA: Diagnosis present

## 2015-12-04 DIAGNOSIS — R0902 Hypoxemia: Secondary | ICD-10-CM

## 2015-12-04 DIAGNOSIS — S62102D Fracture of unspecified carpal bone, left wrist, subsequent encounter for fracture with routine healing: Secondary | ICD-10-CM | POA: Diagnosis not present

## 2015-12-04 DIAGNOSIS — E079 Disorder of thyroid, unspecified: Secondary | ICD-10-CM | POA: Diagnosis present

## 2015-12-04 DIAGNOSIS — Z87891 Personal history of nicotine dependence: Secondary | ICD-10-CM

## 2015-12-04 DIAGNOSIS — R296 Repeated falls: Secondary | ICD-10-CM | POA: Diagnosis present

## 2015-12-04 DIAGNOSIS — R4182 Altered mental status, unspecified: Secondary | ICD-10-CM

## 2015-12-04 DIAGNOSIS — N183 Chronic kidney disease, stage 3 (moderate): Secondary | ICD-10-CM | POA: Diagnosis present

## 2015-12-04 DIAGNOSIS — N179 Acute kidney failure, unspecified: Secondary | ICD-10-CM | POA: Diagnosis present

## 2015-12-04 DIAGNOSIS — S42401D Unspecified fracture of lower end of right humerus, subsequent encounter for fracture with routine healing: Secondary | ICD-10-CM

## 2015-12-04 DIAGNOSIS — Z515 Encounter for palliative care: Secondary | ICD-10-CM | POA: Diagnosis present

## 2015-12-04 DIAGNOSIS — Z7982 Long term (current) use of aspirin: Secondary | ICD-10-CM | POA: Diagnosis not present

## 2015-12-04 DIAGNOSIS — A419 Sepsis, unspecified organism: Secondary | ICD-10-CM | POA: Diagnosis not present

## 2015-12-04 LAB — CBC WITH DIFFERENTIAL/PLATELET
Basophils Absolute: 0.1 10*3/uL (ref 0–0.1)
Basophils Relative: 1 %
Eosinophils Absolute: 0.2 10*3/uL (ref 0–0.7)
Eosinophils Relative: 3 %
HEMATOCRIT: 33.5 % — AB (ref 40.0–52.0)
HEMOGLOBIN: 10.9 g/dL — AB (ref 13.0–18.0)
LYMPHS ABS: 1.1 10*3/uL (ref 1.0–3.6)
Lymphocytes Relative: 18 %
MCH: 33 pg (ref 26.0–34.0)
MCHC: 32.4 g/dL (ref 32.0–36.0)
MCV: 101.7 fL — AB (ref 80.0–100.0)
MONOS PCT: 6 %
Monocytes Absolute: 0.3 10*3/uL (ref 0.2–1.0)
NEUTROS ABS: 4.4 10*3/uL (ref 1.4–6.5)
NEUTROS PCT: 72 %
Platelets: 226 10*3/uL (ref 150–440)
RBC: 3.3 MIL/uL — ABNORMAL LOW (ref 4.40–5.90)
RDW: 13.7 % (ref 11.5–14.5)
WBC: 6 10*3/uL (ref 3.8–10.6)

## 2015-12-04 LAB — COMPREHENSIVE METABOLIC PANEL
ALBUMIN: 2.7 g/dL — AB (ref 3.5–5.0)
ALK PHOS: 62 U/L (ref 38–126)
ALT: 22 U/L (ref 17–63)
AST: 27 U/L (ref 15–41)
Anion gap: 0 — ABNORMAL LOW (ref 5–15)
BILIRUBIN TOTAL: 0.2 mg/dL — AB (ref 0.3–1.2)
BUN: 34 mg/dL — ABNORMAL HIGH (ref 6–20)
CALCIUM: 8.1 mg/dL — AB (ref 8.9–10.3)
CO2: 31 mmol/L (ref 22–32)
CREATININE: 1.63 mg/dL — AB (ref 0.61–1.24)
Chloride: 111 mmol/L (ref 101–111)
GFR calc non Af Amer: 33 mL/min — ABNORMAL LOW (ref >60)
GFR, EST AFRICAN AMERICAN: 39 mL/min — AB (ref >60)
GLUCOSE: 127 mg/dL — AB (ref 65–99)
Potassium: 4.6 mmol/L (ref 3.5–5.1)
SODIUM: 142 mmol/L (ref 135–145)
Total Protein: 6.4 g/dL — ABNORMAL LOW (ref 6.5–8.1)

## 2015-12-04 LAB — URINALYSIS COMPLETE WITH MICROSCOPIC (ARMC ONLY)
Bilirubin Urine: NEGATIVE
Glucose, UA: NEGATIVE mg/dL
Hgb urine dipstick: NEGATIVE
KETONES UR: NEGATIVE mg/dL
Leukocytes, UA: NEGATIVE
Nitrite: NEGATIVE
PH: 5 (ref 5.0–8.0)
PROTEIN: NEGATIVE mg/dL
Specific Gravity, Urine: 1.018 (ref 1.005–1.030)
Squamous Epithelial / LPF: NONE SEEN

## 2015-12-04 LAB — LACTIC ACID, PLASMA
Lactic Acid, Venous: 1.6 mmol/L (ref 0.5–2.0)
Lactic Acid, Venous: 1.5 mmol/L (ref 0.5–2.0)

## 2015-12-04 LAB — BRAIN NATRIURETIC PEPTIDE: B Natriuretic Peptide: 432 pg/mL — ABNORMAL HIGH (ref 0.0–100.0)

## 2015-12-04 LAB — TROPONIN I: Troponin I: 0.06 ng/mL — ABNORMAL HIGH (ref <0.031)

## 2015-12-04 MED ORDER — DEXTROSE-NACL 5-0.9 % IV SOLN
INTRAVENOUS | Status: AC
  Administered 2015-12-04 – 2015-12-05 (×2): via INTRAVENOUS

## 2015-12-04 MED ORDER — HALOPERIDOL LACTATE 5 MG/ML IJ SOLN
2.0000 mg | Freq: Once | INTRAMUSCULAR | Status: AC
  Administered 2015-12-04: 2 mg via INTRAVENOUS

## 2015-12-04 MED ORDER — HALOPERIDOL LACTATE 5 MG/ML IJ SOLN
INTRAMUSCULAR | Status: AC
  Administered 2015-12-04: 2 mg via INTRAVENOUS
  Filled 2015-12-04: qty 1

## 2015-12-04 MED ORDER — ENOXAPARIN SODIUM 30 MG/0.3ML ~~LOC~~ SOLN
30.0000 mg | SUBCUTANEOUS | Status: DC
  Administered 2015-12-04 – 2015-12-05 (×2): 30 mg via SUBCUTANEOUS
  Filled 2015-12-04 (×2): qty 0.3

## 2015-12-04 MED ORDER — DEXTROSE 5 % IV SOLN
1.0000 g | INTRAVENOUS | Status: DC
  Administered 2015-12-04 – 2015-12-05 (×2): 1 g via INTRAVENOUS
  Filled 2015-12-04 (×3): qty 1

## 2015-12-04 MED ORDER — DEXTROSE 5 % IV SOLN
1.0000 g | Freq: Three times a day (TID) | INTRAVENOUS | Status: DC
  Filled 2015-12-04 (×3): qty 1

## 2015-12-04 MED ORDER — VANCOMYCIN HCL IN DEXTROSE 750-5 MG/150ML-% IV SOLN
750.0000 mg | INTRAVENOUS | Status: DC
  Administered 2015-12-05: 750 mg via INTRAVENOUS
  Filled 2015-12-04 (×2): qty 150

## 2015-12-04 MED ORDER — PIPERACILLIN-TAZOBACTAM 3.375 G IVPB
3.3750 g | Freq: Once | INTRAVENOUS | Status: AC
  Administered 2015-12-04: 3.375 g via INTRAVENOUS
  Filled 2015-12-04: qty 50

## 2015-12-04 MED ORDER — VANCOMYCIN HCL IN DEXTROSE 1-5 GM/200ML-% IV SOLN
1000.0000 mg | Freq: Once | INTRAVENOUS | Status: DC
  Filled 2015-12-04: qty 200

## 2015-12-04 NOTE — ED Notes (Signed)
UTA swallow screen at this time. Patient refused to follow commands for swallow screen

## 2015-12-04 NOTE — H&P (Signed)
[facility] - [place] at [facility]   PATIENT NAME: Jermaine Mcclain    MR#:  [id number]  DATE OF BIRTH:  Jun 07, 1915  DATE OF ADMISSION:  12/04/2015  PRIMARY CARE PHYSICIAN: Lauro Regulus., MD   REQUESTING/REFERRING PHYSICIAN: Malinda  CHIEF COMPLAINT:   Shortness of breath and hypoxia HISTORY OF PRESENT ILLNESS:  Jermaine Mcclain  is a 82 y.o. male with a known history of congestive heart failure, frequent falls, hypertension, recent right right elbow fracture and left wrist fracture with cast is brought into the ED for shortness of breath and being hypoxemic. Patient's pulse ox was at 88% on room air. Patient was placed on oxygen 2 L via nasal cannula and subsequently pulse ox went up to 92-95%. Chest x-ray has revealed patchy consolidation in the left lower lobe with possible aspiration. Daughter is at bedside and providing history  PAST MEDICAL HISTORY:   Past Medical History  Diagnosis Date  . Hypertension   . CHF (congestive heart failure) (HCC)   . Thyroid disease   . Arm fracture left    PAST SURGICAL HISTOIRY:  History reviewed. No pertinent past surgical history.  SOCIAL HISTORY:   Social History  Substance Use Topics  . Smoking status: Former Games developer  . Smokeless tobacco: Never Used  . Alcohol Use: No    FAMILY HISTORY:  No family history on file.  DRUG ALLERGIES:  No Known Allergies  REVIEW OF SYSTEMS:  Review of system unobtainable as the patient is currently confused with baseline dementia  MEDICATIONS AT HOME:   Prior to Admission medications   Medication Sig Start Date End Date Taking? Authorizing Provider  acetaminophen (TYLENOL) 325 MG tablet Take 650 mg by mouth every 4 (four) hours as needed for mild pain, fever or headache.   Yes Historical Provider, MD  aspirin EC 81 MG tablet Take 81 mg by mouth daily.   Yes Historical Provider, MD  levothyroxine (SYNTHROID, LEVOTHROID) 125 MCG tablet Take 125 mcg by mouth daily  before breakfast.   Yes Historical Provider, MD  mirtazapine (REMERON) 7.5 MG tablet Take 7.5 mg by mouth at bedtime.   Yes Historical Provider, MD  Multiple Vitamin (MULTIVITAMIN WITH MINERALS) TABS tablet Take 1 tablet by mouth daily.   Yes Historical Provider, MD  oxyCODONE-acetaminophen (ROXICET) 5-325 MG tablet Take 1 tablet by mouth every 6 (six) hours as needed. Patient taking differently: Take 1 tablet by mouth every 6 (six) hours as needed for severe pain.  10/31/15  Yes Minna Antis, MD  vitamin B-12 (CYANOCOBALAMIN) 500 MCG tablet Take 500 mcg by mouth daily.   Yes Historical Provider, MD  cephALEXin (KEFLEX) 250 MG capsule Take 1 capsule (250 mg total) by mouth 2 (two) times daily. Patient not taking: Reported on 12/04/2015 11/19/15   Enedina Finner, MD      VITAL SIGNS:  Blood pressure 119/53, pulse 100, temperature 98.1 F (36.7 C), temperature source Oral, resp. rate 25, height  (1.778 m), weight 77.111 kg (170 lb), SpO2 94 %.  PHYSICAL EXAMINATION:  GENERAL:  82 y.o.-year-old patient lying in the bed with no acute distress.  EYES: Pupils equal, round, reactive to light and accommodation. No scleral icterus. Extraocular muscles intact.  HEENT: Head atraumatic, normocephalic. Oropharynx and nasopharynx clear.  NECK:  Supple, no jugular venous distention. No thyroid enlargement, no tenderness.  LUNGS:   coarse and diminished breath sounds bilaterally, no wheezing,  positive crackles, no rales,rhonchi or crepitation. No use of accessory muscles of respiration.  CARDIOVASCULAR: S1, S2 normal. No murmurs, rubs, or gallops.  ABDOMEN: Soft, nontender, nondistended. Bowel sounds present. No organomegaly or mass.  EXTREMITIES:  left wrist in a cast, right elbow is tender from fracture  No pedal edema, cyanosis, or clubbing.  NEUROLOGIC: Patient is pleasantly confused  Sensation intact. Gait not checked.  PSYCHIATRIC: The patient is alert and oriented x 1 SKIN: No obvious rash,  lesion, or ulcer.   LABORATORY PANEL:   CBC  Recent Labs Lab 12/04/15 1813  WBC 6.0  HGB 10.9*  HCT 33.5*  PLT 226   ------------------------------------------------------------------------------------------------------------------  Chemistries  No results for input(s): NA, K, CL, CO2, GLUCOSE, BUN, CREATININE, CALCIUM, MG, AST, ALT, ALKPHOS, BILITOT in the last 168 hours.  Invalid input(s): GFRCGP ------------------------------------------------------------------------------------------------------------------  Cardiac Enzymes No results for input(s): TROPONINI in the last 168 hours. ------------------------------------------------------------------------------------------------------------------  RADIOLOGY:  Dg Chest Portable 1 View  12/04/2015  CLINICAL DATA:  Cough and shortness of breath. EXAM: PORTABLE CHEST 1 VIEW COMPARISON:  10/04/2013 chest radiograph. FINDINGS: Stable cardiomediastinal silhouette with top-normal heart size. No pneumothorax. No pleural effusion. New patchy consolidation in the retrocardiac left lung base. No pulmonary edema. IMPRESSION: New patchy consolidation in the retrocardiac left lung base, nonspecific, differential includes aspiration or pneumonia. Electronically Signed   By: Delbert Phenix M.D.   On: 12/04/2015 18:23    EKG:   Orders placed or performed during the hospital encounter of 12/04/15  . ED EKG  . ED EKG  . EKG 12-Lead  . EKG 12-Lead    IMPRESSION AND PLAN:   1. Healthcare associated pneumonia with hypoxia and altered mental status Continue oxygen via nasal cannula Blood cultures 2 and sputum culture and sensitivity are ordered including Gram stain Urine for Legionella antigen and streptococcal antigen was ordered per protocol Patient is started on Zosyn in the ED. Will continue cefepime and vancomycin Keep him nothing by mouth for bedside swallow evaluation, if positive for aspiration will consult speech pathology Will add  probiotics to the regimen when patient is taking by mouth Hydration with IV fluids will be provided. Check a.m. Labs.  2. History of dementia Resume his home medications once patient is tolerating by mouth  3. History of congestive heart failure currently not fluid overloaded Will provide gentle hydration with IV fluids for healthcare associated pneumonia and watch for symptoms and signs of fluid overload  4. Frequent falls with history of left wrist fracture and right elbow fracture Patient did not let splinting or  surgery on right elbow Left wrist fracture with splint Provide pain management as needed   All the records are reviewed and case discussed with ED provider. Management plans discussed with the patient, family and they are in agreement.  CODE STATUS: Full code, daughter is the healthcare power of attorney  TOTAL TIME TAKING CARE OF THIS PATIENT: 45 minutes.    Ramonita Lab M.D on 12/04/2015 at 7:11 PM  Between 7am to 6pm - Pager - [phone]  After 6pm go to [email or web address] - password [email or web address] [facility]  [place] [facility]  Office  [phone]  CC: Primary care physician; Lauro Regulus., MD

## 2015-12-04 NOTE — ED Provider Notes (Signed)
[facility] Emergency Department Provider Note  ____________________________________________  Time seen: Approximately 6:30 PM  I have reviewed the triage vital signs and the nursing notes.   HISTORY  Chief Complaint Shortness of Breath and Altered Mental Status  history Limited by altered mental status   HPI Jermaine Mcclain is a 82 y.o. male patient comes from nursing home with low O2 sats and altered mental status. Patient was recently admitted there for frequent falls resulting in a fractured left arm. Patient appears to be confused he cannot give me a good history first says he can't sit up than when I get him to sit up so I can listen to his lungs he says he has to get up to stand up so he can go see his girlfriend. He repeatedly tries to get out of bed and cannot be distracted he cannot give me a good history at all he does have a deep wet cough.  Past Medical History  Diagnosis Date  . Hypertension   . CHF (congestive heart failure) (HCC)   . Thyroid disease   . Arm fracture left    Patient Active Problem List   Diagnosis Date Noted  . Healthcare-associated pneumonia 12/04/2015  . Pressure ulcer 11/17/2015  . Acute on chronic renal failure (HCC) 11/16/2015    History reviewed. No pertinent past surgical history.  No current outpatient prescriptions on file.  Allergies Review of patient's allergies indicates no known allergies.  History reviewed. No pertinent family history.  Social History Social History  Substance Use Topics  . Smoking status: Former Games developer  . Smokeless tobacco: Never Used  . Alcohol Use: No    Review of Systems Unable to obtain due to altered mental status  ____________________________________________   PHYSICAL EXAM:  VITAL SIGNS: ED Triage Vitals  Enc Vitals Group     BP 12/04/15 1807 144/82 mmHg     Pulse Rate 12/04/15 1807 87     Resp 12/04/15 1807 30     Temp 12/04/15 1807 98.1 F (36.7 C)      Temp Source 12/04/15 1807 Oral     SpO2 12/04/15 1807 88 %     Weight 12/04/15 1807 170 lb (77.111 kg)     Height 12/04/15 1807  (1.778 m)     Head Cir --      Peak Flow --      Pain Score --      Pain Loc --      Pain Edu? --      Excl. in GC? --     Constitutional: Alert but confused Eyes: Conjunctivae are normal. PERRL. EOMI. Head: Atraumatic. Nose: No congestion/rhinnorhea. Mouth/Throat: Mucous membranes are moist.  Oropharynx non-erythematous. Neck: No stridor.  Cardiovascular: Normal rate, regular rhythm. Grossly normal heart sounds.  Good peripheral circulation. Respiratory: Normal respiratory effort.  No retractions. Lungs diffuse crackles  Gastrointestinal: Soft and nontender. No distention. No abdominal bruits. No CVA tenderness. Musculoskeletal: No lower extremity tenderness nor edema.  No joint effusions. Left arm is in a cast there is no swelling in the hand distally Neurologic:  Normal speech and language although confused. No gross focal neurologic deficits are appreciated.  Skin:  Skin is warm, dry and intact. No rash noted.   ____________________________________________   LABS (all labs ordered are listed, but only abnormal results are displayed)  Labs Reviewed  MRSA PCR SCREENING - Abnormal; Notable for the following:    MRSA by PCR POSITIVE (*)  All other components within normal limits  COMPREHENSIVE METABOLIC PANEL - Abnormal; Notable for the following:    Glucose, Bld 127 (*)    BUN 34 (*)    Creatinine, Ser 1.63 (*)    Calcium 8.1 (*)    Total Protein 6.4 (*)    Albumin 2.7 (*)    Total Bilirubin 0.2 (*)    GFR calc non Af Amer 33 (*)    GFR calc Af Amer 39 (*)    Anion gap 0 (*)    All other components within normal limits  BRAIN NATRIURETIC PEPTIDE - Abnormal; Notable for the following:    B Natriuretic Peptide 432.0 (*)    All other components within normal limits  TROPONIN I - Abnormal; Notable for the following:    Troponin I  0.06 (*)    All other components within normal limits  CBC WITH DIFFERENTIAL/PLATELET - Abnormal; Notable for the following:    RBC 3.30 (*)    Hemoglobin 10.9 (*)    HCT 33.5 (*)    MCV 101.7 (*)    All other components within normal limits  URINALYSIS COMPLETEWITH MICROSCOPIC ([facility] ONLY) - Abnormal; Notable for the following:    Color, Urine YELLOW (*)    APPearance CLEAR (*)    Bacteria, UA RARE (*)    All other components within normal limits  CBC - Abnormal; Notable for the following:    RBC 3.40 (*)    Hemoglobin 11.4 (*)    HCT 34.9 (*)    MCV 102.5 (*)    All other components within normal limits  BASIC METABOLIC PANEL - Abnormal; Notable for the following:    Sodium 146 (*)    Glucose, Bld 136 (*)    BUN 35 (*)    Creatinine, Ser 2.08 (*)    GFR calc non Af Amer 25 (*)    GFR calc Af Amer 29 (*)    All other components within normal limits  GLUCOSE, CAPILLARY - Abnormal; Notable for the following:    Glucose-Capillary 122 (*)    All other components within normal limits  CULTURE, BLOOD (ROUTINE X 2)  CULTURE, BLOOD (ROUTINE X 2)  LACTIC ACID, PLASMA  LACTIC ACID, PLASMA  HIV ANTIBODY (ROUTINE TESTING)  STREP PNEUMONIAE URINARY ANTIGEN  LEGIONELLA ANTIGEN, URINE  MISC [facility] TEST (SEND OUT)   ____________________________________________  EKG  KG read and interpreted by me. Baseline is irregular and difficult to interpret He has normal sinus rhythm with first-degree AV block and possibly there are no visible acute ST-T wave changes ____________________________________________  RADIOLOGY  Chest x-ray read by radiology is patchy left-sided pneumonia. Computer is not well immediately visualized the films. ____________________________________________   PROCEDURES   ____________________________________________   INITIAL IMPRESSION / ASSESSMENT AND PLAN / ED COURSE  Pertinent labs & imaging results that were available during my care of the patient were  reviewed by me and considered in my medical decision making (see chart for details).   ____________________________________________   FINAL CLINICAL IMPRESSION(S) / ED DIAGNOSES  Final diagnoses:  Healthcare-associated pneumonia  Hypoxia  Altered mental status, unspecified altered mental status type      Arnaldo Natal, MD 12/06/15 2351

## 2015-12-04 NOTE — ED Notes (Signed)
From [facility] Per EMS: ams today, breathing problems yesterday, placed on o2 yesterday.  initial spo2 89-88, 135/58, spo2 on 95% on 2L, co2 22-39.

## 2015-12-04 NOTE — Consult Note (Signed)
ANTIBIOTIC CONSULT NOTE - INITIAL  Pharmacy Consult for vancomycin/cefepime Indication: pneumonia  No Known Allergies  Patient Measurements: Height:  (177.8 cm) Weight: 170 lb (77.111 kg) IBW/kg (Calculated) : 73 Adjusted Body Weight: 77.1kg  Vital Signs: Temp: 98.1 F (36.7 C) (02/01 1807) Temp Source: Oral (02/01 1807) BP: 119/53 mmHg (02/01 1815) Pulse Rate: 100 (02/01 1830) Intake/Output from previous day:   Intake/Output from this shift:    Labs:  Recent Labs  12/04/15 1813  WBC 6.0  HGB 10.9*  PLT 226   Estimated Creatinine Clearance: 24.2 mL/min (by C-G formula based on Cr of 1.76). No results for input(s): VANCOTROUGH, VANCOPEAK, VANCORANDOM, GENTTROUGH, GENTPEAK, GENTRANDOM, TOBRATROUGH, TOBRAPEAK, TOBRARND, AMIKACINPEAK, AMIKACINTROU, AMIKACIN in the last 72 hours.   Microbiology: No results found for this or any previous visit (from the past 720 hour(s)).  Medical History: Past Medical History  Diagnosis Date  . Hypertension   . CHF (congestive heart failure) (HCC)   . Thyroid disease   . Arm fracture left    Medications:  Scheduled:   Assessment: Pt is a 82 year old male who presents with SOB/hypoxemia. Chest x-ray shows patchy consolidation in left lower lobe with possible aspiration. Pt was given one dose of zosyn in the ED. Pharmacy consulted to dose cefepime and vancomycin  Goal of Therapy:  Vancomycin trough level 15-20 mcg/ml  Plan:  Expected duration 7 days with resolution of temperature and/or normalization of WBC Measure antibiotic drug levels at steady state Follow up culture results cefepime 1g q 24 hours due to renal function. vancomycin 1g once then  q 24 hours starting 6 hours after inital dose. Will draw trough prior to 5th dose 0206 @ 0230. Pharmacy will continue to monitor. Thank you for allowing [place] to be apart of this patient care.  Finnegan Gatta D Moroni Nester 12/04/2015,7:41 PM

## 2015-12-05 ENCOUNTER — Encounter: Payer: Self-pay | Admitting: *Deleted

## 2015-12-05 LAB — STREP PNEUMONIAE URINARY ANTIGEN: Strep Pneumo Urinary Antigen: NEGATIVE

## 2015-12-05 LAB — MRSA PCR SCREENING: MRSA by PCR: POSITIVE — AB

## 2015-12-05 MED ORDER — CHLORHEXIDINE GLUCONATE CLOTH 2 % EX PADS: 6.0000 | MEDICATED_PAD | Freq: Every day | CUTANEOUS | Status: DC

## 2015-12-05 MED ORDER — IPRATROPIUM-ALBUTEROL 0.5-2.5 (3) MG/3ML IN SOLN
3.0000 mL | RESPIRATORY_TRACT | Status: DC
  Administered 2015-12-05 – 2015-12-06 (×5): 3 mL via RESPIRATORY_TRACT
  Filled 2015-12-05 (×5): qty 3

## 2015-12-05 MED ORDER — MORPHINE SULFATE (PF) 2 MG/ML IV SOLN
2.0000 mg | Freq: Four times a day (QID) | INTRAVENOUS | Status: DC | PRN
  Administered 2015-12-05 (×2): 2 mg via INTRAVENOUS
  Filled 2015-12-05 (×3): qty 1

## 2015-12-05 MED ORDER — FUROSEMIDE 10 MG/ML IJ SOLN
40.0000 mg | Freq: Once | INTRAMUSCULAR | Status: AC
  Administered 2015-12-05: 40 mg via INTRAVENOUS
  Filled 2015-12-05: qty 4

## 2015-12-05 MED ORDER — LORAZEPAM 2 MG/ML IJ SOLN
1.0000 mg | INTRAMUSCULAR | Status: DC | PRN
  Administered 2015-12-05: 1 mg via INTRAVENOUS
  Filled 2015-12-05: qty 1

## 2015-12-05 MED ORDER — ACETAMINOPHEN 325 MG PO TABS: 650.0000 mg | ORAL_TABLET | Freq: Four times a day (QID) | ORAL | Status: DC | PRN

## 2015-12-05 MED ORDER — MUPIROCIN 2 % EX OINT
TOPICAL_OINTMENT | Freq: Two times a day (BID) | CUTANEOUS | Status: DC
  Administered 2015-12-05 (×2): via NASAL
  Filled 2015-12-05: qty 22

## 2015-12-05 NOTE — Care Management (Signed)
Patient admitted from [facility] with healthcare associated PNA. CSW aware of admission.  Order for hospice screen.  Per order patient was open with [facility] in the past, and would like to speak with Clydie Braun from [facility]  Clydie Braun notified.

## 2015-12-05 NOTE — Clinical Social Work Note (Signed)
Clinical Social Work Assessment  Patient Details  Name: Jermaine Mcclain MRN: [id number] Date of Birth: 1915/02/27  Date of referral:  12/05/15               Reason for consult:  Facility Placement                Permission sought to share information with:    Permission granted to share information::     Name::        Agency::     Relationship::     Contact Information:     Housing/Transportation Living arrangements for the past 2 months:  [place], [place] of Information:  Adult Children Patient Interpreter Needed:  None Criminal Activity/Legal Involvement Pertinent to Current Situation/Hospitalization:  No - Comment as needed Significant Relationships:  Adult Children Lives with:  Self Do you feel safe going back to the place where you live?    Need for family participation in patient care:  Yes (Comment)  Care giving concerns:  Patient has been at [facility] for short term rehab.    Social Worker assessment / plan:  CSW met with patient's daughter, in patient's room this afternoon. Patient was sleeping and restless during our conversation. Patient's daughter confirmed patient has been at [facility] for rehab however, they are now considering hospice services as patient had hospice previously. Patient's daughter states that if patient does not respond to treatment, that they will more than likely opt for hospice home. CSW will continue to follow.  Employment status:  Retired Nurse, adult PT Recommendations:  Not assessed at this time Information / Referral to community resources:     Patient/Family's Response to care:  Patient's daughter expressed appreciation for CSW assistance.  Patient/Family's Understanding of and Emotional Response to Diagnosis, Current Treatment, and Prognosis:  Patient's daughter verbalized understanding of the graveness of patient's situation.  Emotional Assessment Appearance:   Appears stated age Attitude/Demeanor/Rapport:   (sleeping) Affect (typically observed):   (sleeping) Orientation:    Alcohol / Substance use:  Not Applicable Psych involvement (Current and /or in the community):  No (Comment)  Discharge Needs  Concerns to be addressed:  Care Coordination Readmission within the last 30 days:  Yes Current discharge risk:  None Barriers to Discharge:  No Barriers Identified   Shela Leff, LCSW 12/05/2015, 2:48 PM

## 2015-12-05 NOTE — Progress Notes (Signed)
[facility] - [place] at [facility]   PATIENT NAME: Jermaine Mcclain    MR#:  [id number]  DATE OF BIRTH:  August 20, 1915  SUBJECTIVE:  CHIEF COMPLAINT:   Chief Complaint  Patient presents with  . Shortness of Breath  . Altered Mental Status   - admitted with delirium and hypoxia, noted to have left sided pneumonia - very confused, agitated, requesting to eat something- has been NPO - family at bedside  REVIEW OF SYSTEMS:  Review of Systems  Unable to perform ROS: acuity of condition    DRUG ALLERGIES:  No Known Allergies  VITALS:  Blood pressure 123/79, pulse 78, temperature 98.3 F (36.8 C), temperature source Oral, resp. rate 17, height  (1.778 m), weight 77.111 kg (170 lb), SpO2 95 %.  PHYSICAL EXAMINATION:  Physical Exam  GENERAL:  82 y.o.-year-old elderly patient lying in the bed, noted to have gurgling secretions.  EYES: Pupils equal, round, reactive to light and accommodation. No scleral icterus. Extraocular muscles intact.  HEENT: Head atraumatic, normocephalic. Oropharynx and nasopharynx clear.  NECK:  Supple, no jugular venous distention. No thyroid enlargement, no tenderness.  LUNGS: Normal breath sounds bilaterally, no wheezing, rales or crepitation. Coarse rhonchi bilaterally, gurgling upper airway sounds. No use of accessory muscles of respiration.  CARDIOVASCULAR: S1, S2 normal. No rubs, or gallops. 3/6 systolic murmur present. ABDOMEN: Soft, nontender, nondistended. Bowel sounds present. No organomegaly or mass.  EXTREMITIES: No pedal edema, cyanosis, or clubbing.  NEUROLOGIC: Not cooperative for a neuro exam- able to move all extremities in bed. PSYCHIATRIC: The patient is alert and not oriented  SKIN: No obvious rash, lesion, or ulcer.    LABORATORY PANEL:   CBC  Recent Labs Lab 12/04/15 1813  WBC 6.0  HGB 10.9*  HCT 33.5*  PLT 226    ------------------------------------------------------------------------------------------------------------------  Chemistries   Recent Labs Lab 12/04/15 1813  NA 142  K 4.6  CL 111  CO2 31  GLUCOSE 127*  BUN 34*  CREATININE 1.63*  CALCIUM 8.1*  AST 27  ALT 22  ALKPHOS 62  BILITOT 0.2*   ------------------------------------------------------------------------------------------------------------------  Cardiac Enzymes  Recent Labs Lab 12/04/15 1813  TROPONINI 0.06*   ------------------------------------------------------------------------------------------------------------------  RADIOLOGY:  Dg Chest Portable 1 View  12/04/2015  CLINICAL DATA:  Cough and shortness of breath. EXAM: PORTABLE CHEST 1 VIEW COMPARISON:  10/04/2013 chest radiograph. FINDINGS: Stable cardiomediastinal silhouette with top-normal heart size. No pneumothorax. No pleural effusion. New patchy consolidation in the retrocardiac left lung base. No pulmonary edema. IMPRESSION: New patchy consolidation in the retrocardiac left lung base, nonspecific, differential includes aspiration or pneumonia. Electronically Signed   By: Delbert Phenix M.D.   On: 12/04/2015 18:23    EKG:   Orders placed or performed during the hospital encounter of 12/04/15  . ED EKG  . ED EKG  . EKG 12-Lead  . EKG 12-Lead    ASSESSMENT AND PLAN:   82y/o M with PMH of CHF, HTN, recent fall and elbow fracture or matured rehabilitation was brought in secondary to shortness of breath and hypoxemia.  #1 healthcare acquired pneumonia- do not rule out aspiration pneumonia. And had a recent swallow eval as outpatient 1 week ago and came back normal. -Blood cultures are pending. -Chest x-ray with left lower lobe pneumonia. Continue vancomycin and cefepime for now. -Patient unable to clear his secretions, flutter valve and duonebs ordered -Cannot suction at this time as they are deep in the throat -Up it swallow eval today.  #2  acute delirium-secondary to underlying pneumonia. Abdomen at baseline patient has mild to moderate dementia with short-term memory loss. -Has been agitated and combative overnight. Much improved today but still confused. Continue as needed Ativan at this time. - last week had a CT of his head from a fall and that didn't show any acute findings. No focal neurological deficits noted at this time.  #3 congestive heart failure-no evidence of fluid overload at this time. Discontinue IV fluids. -Continue to monitor. Lasix as needed  #4 frequent falls with recent right elbow fracture -Patient in a splint. Continue pain management as needed.  Discussed with power of attorney at length at bedside, continue current measures. Patient is DO NOT RESUSCITATE at this time. If no improvement in the next 1-2 days they would like to consider hospice. -Hospice screen ordered as patient was seen by [facility] prior to going to rehabilitation.    All the records are reviewed and case discussed with Care Management/Social Workerr. Management plans discussed with the patient, family and they are in agreement.  CODE STATUS: DNR  TOTAL TIME TAKING CARE OF THIS PATIENT: 37 minutes.   POSSIBLE D/C IN 2-3 DAYS, DEPENDING ON CLINICAL CONDITION.   Enid Baas M.D on 12/05/2015 at 3:24 PM  Between 7am to 6pm - Pager - [phone]  After 6pm go to [email or web address] - password [email or web address] [facility]  [place] [facility]  Office  [phone]  CC: Primary care physician; Lauro Regulus., MD

## 2015-12-05 NOTE — [place] FL2 (Signed)
  [place] MEDICAID FL2 LEVEL OF CARE SCREENING TOOL     IDENTIFICATION  Patient Name: Jermaine Mcclain Birthdate: 23-Jul-1915 Sex: male Admission Date (Current Location): 12/04/2015  [place] and [place] Number:  Chiropodist and Address:  [facility], [address] [place], [place] [address]      Provider Number: [id number]  Attending Physician Name and Address:  Enid Baas, MD  Relative Name and Phone Number:       Current Level of Care: Hospital Recommended Level of Care: Skilled Nursing Facility Prior Approval Number:    Date Approved/Denied:   PASRR Number:    Discharge Plan: SNF    Current Diagnoses: Patient Active Problem List   Diagnosis Date Noted  . Healthcare-associated pneumonia 12/04/2015  . Pressure ulcer 11/17/2015  . Acute on chronic renal failure (HCC) 11/16/2015    Orientation RESPIRATION BLADDER Height & Weight     Self  Normal Continent Weight: 170 lb (77.111 kg) Height:   (177.8 cm)  BEHAVIORAL SYMPTOMS/MOOD NEUROLOGICAL BOWEL NUTRITION STATUS   (none)  (none) Continent    AMBULATORY STATUS COMMUNICATION OF NEEDS Skin   Extensive Assist Verbally Normal                       Personal Care Assistance Level of Assistance  Bathing, Dressing, Feeding Bathing Assistance: Limited assistance Feeding assistance: Limited assistance Dressing Assistance: Limited assistance     Functional Limitations Info      Hearing Info: Impaired      SPECIAL CARE FACTORS FREQUENCY                       Contractures      Additional Factors Info  Isolation Precautions               Current Medications (12/05/2015):  This is the current hospital active medication list Current Facility-Administered Medications  Medication Dose Route Frequency Provider Last Rate Last Dose  . ceFEPIme (MAXIPIME) 1 g in dextrose 5 % 50 mL IVPB  1 g Intravenous Q24H Olene Floss, RPH   1 g at  12/04/15 2346  . [START ON 12/06/2015] Chlorhexidine Gluconate Cloth 2 % PADS 6 each  6 each Topical Q0600 Enid Baas, MD      . enoxaparin (LOVENOX) injection 30 mg  30 mg Subcutaneous Q24H Ramonita Lab, MD   30 mg at 12/04/15 2346  . ipratropium-albuterol (DUONEB) 0.5-2.5 (3) MG/3ML nebulizer solution 3 mL  3 mL Nebulization Q4H Enid Baas, MD      . LORazepam (ATIVAN) injection 1 mg  1 mg Intravenous Q4H PRN Enid Baas, MD      . morphine 2 MG/ML injection 2 mg  2 mg Intravenous Q6H PRN Ihor Austin, MD   2 mg at 12/05/15 0654  . mupirocin ointment (BACTROBAN) 2 %   Nasal BID Enid Baas, MD      . vancomycin (VANCOCIN) IVPB 1000 mg/200 mL premix  1,000 mg Intravenous Once Olene Floss, RPH   1,000 mg at 12/04/15 2030  . vancomycin (VANCOCIN) IVPB 750 mg/150 ml premix  750 mg Intravenous Q24H Olene Floss, RPH   750 mg at 12/05/15 0300     Discharge Medications: Please see discharge summary for a list of discharge medications.  Relevant Imaging Results:  Relevant Lab Results:   Additional Information    York Spaniel, LCSW

## 2015-12-06 LAB — HIV ANTIBODY (ROUTINE TESTING W REFLEX): HIV SCREEN 4TH GENERATION: NONREACTIVE

## 2015-12-06 LAB — BASIC METABOLIC PANEL
ANION GAP: 10 (ref 5–15)
BUN: 35 mg/dL — ABNORMAL HIGH (ref 6–20)
CALCIUM: 9.1 mg/dL (ref 8.9–10.3)
CO2: 30 mmol/L (ref 22–32)
Chloride: 106 mmol/L (ref 101–111)
Creatinine, Ser: 2.08 mg/dL — ABNORMAL HIGH (ref 0.61–1.24)
GFR calc Af Amer: 29 mL/min — ABNORMAL LOW (ref >60)
GFR, EST NON AFRICAN AMERICAN: 25 mL/min — AB (ref >60)
Glucose, Bld: 136 mg/dL — ABNORMAL HIGH (ref 65–99)
POTASSIUM: 4.8 mmol/L (ref 3.5–5.1)
SODIUM: 146 mmol/L — AB (ref 135–145)

## 2015-12-06 LAB — CBC
HCT: 34.9 % — ABNORMAL LOW (ref 40.0–52.0)
Hemoglobin: 11.4 g/dL — ABNORMAL LOW (ref 13.0–18.0)
MCH: 33.6 pg (ref 26.0–34.0)
MCHC: 32.7 g/dL (ref 32.0–36.0)
MCV: 102.5 fL — ABNORMAL HIGH (ref 80.0–100.0)
PLATELETS: 248 10*3/uL (ref 150–440)
RBC: 3.4 MIL/uL — AB (ref 4.40–5.90)
RDW: 14.1 % (ref 11.5–14.5)
WBC: 8.7 10*3/uL (ref 3.8–10.6)

## 2015-12-06 LAB — GLUCOSE, CAPILLARY: GLUCOSE-CAPILLARY: 122 mg/dL — AB (ref 65–99)

## 2015-12-06 MED ORDER — NALOXONE HCL 0.4 MG/ML IJ SOLN
INTRAMUSCULAR | Status: AC
  Filled 2015-12-06: qty 1

## 2015-12-06 MED ORDER — MORPHINE SULFATE (PF) 2 MG/ML IV SOLN
2.0000 mg | INTRAVENOUS | Status: DC | PRN
  Administered 2015-12-06 – 2015-12-07 (×2): 2 mg via INTRAVENOUS
  Filled 2015-12-06: qty 1

## 2015-12-06 MED ORDER — MORPHINE SULFATE (CONCENTRATE) 10 MG/0.5ML PO SOLN: 5.0000 mg | ORAL | Status: DC | PRN

## 2015-12-06 MED ORDER — LORAZEPAM 2 MG/ML IJ SOLN
0.5000 mg | INTRAMUSCULAR | Status: DC | PRN
  Administered 2015-12-06 (×2): 0.5 mg via INTRAVENOUS
  Administered 2015-12-07 – 2015-12-08 (×5): 1 mg via INTRAVENOUS
  Filled 2015-12-06 (×7): qty 1

## 2015-12-06 MED ORDER — NALOXONE HCL 0.4 MG/ML IJ SOLN
0.4000 mg | INTRAMUSCULAR | Status: DC | PRN
  Administered 2015-12-06: 0.4 mg via INTRAVENOUS

## 2015-12-06 MED ORDER — NALOXONE HCL 0.4 MG/ML IJ SOLN
INTRAMUSCULAR | Status: AC
Start: 2015-12-06 — End: 2015-12-06
  Filled 2015-12-06: qty 1

## 2015-12-06 NOTE — Clinical Social Work Note (Signed)
CSW has noted that patient has been placed on the hospice home waiting list and will transfer to hospice home once a bed becomes available. York Spaniel MSW,LCSW [phone]

## 2015-12-06 NOTE — Care Management Important Message (Signed)
Important Message  Patient Details  Name: Jermaine Mcclain MRN: [id number] Date of Birth: [birth year]   Medicare Important Message Given:  Yes    Chapman Fitch, RN 12/06/2015, 5:04 PM

## 2015-12-06 NOTE — Progress Notes (Signed)
Rapid response called for pt for decreased SPO2. RN stated pt SPO2 was initially in the 30's. After placing NRB mask on pt SPO2 in the 80's and increasing. Pt not responding to sternal rub and has agonal, shallow respirations. MD wants his scheduled SVN given and continue with the NRB.

## 2015-12-06 NOTE — Progress Notes (Signed)
Report given to floor RN, family aware and at bedside.

## 2015-12-06 NOTE — Progress Notes (Signed)
[facility] Physicians - [place] at [facility]   PATIENT NAME: Jermaine Mcclain    MR#:  [id number]  DATE OF BIRTH:  19-Jan-1915  SUBJECTIVE:  CHIEF COMPLAINT:   Chief Complaint  Patient presents with  . Shortness of Breath  . Altered Mental Status   - Patient took a turn for the worse last night- unresponsive this am - complained of pain and received morphine and ativan last night- didn't improve with narcan as well this AM - Two daughters at bedside and the other daughter was on phone- considering overall condition- agreed for comfort care.  REVIEW OF SYSTEMS:  Review of Systems  Unable to perform ROS: acuity of condition    DRUG ALLERGIES:  No Known Allergies  VITALS:  Blood pressure 144/60, pulse 80, temperature 97.9 F (36.6 C), temperature source Oral, resp. rate 20, height  (1.778 m), weight 77.111 kg (170 lb), SpO2 96 %.  PHYSICAL EXAMINATION:  Physical Exam  GENERAL:  82 y.o.-year-old elderly patient lying in the bed, appears critically ill and end of life.Marland Kitchen  EYES: Pupils equal, round, reactive to light and accommodation. No scleral icterus. Extraocular muscles intact.  HEENT: Head atraumatic, normocephalic. Oropharynx and nasopharynx clear but dry mucus membranes.Marland Kitchen  NECK:  Supple, no jugular venous distention. No thyroid enlargement, no tenderness.  LUNGS: Normal breath sounds bilaterally, no wheezing, rales or crepitation. Coarse rhonchi bilaterally, gurgling upper airway sounds. No use of accessory muscles of respiration.  CARDIOVASCULAR: S1, S2 normal. No rubs, or gallops. 3/6 systolic murmur present. ABDOMEN: Soft, nontender, nondistended. Bowel sounds present. No organomegaly or mass.  EXTREMITIES: No pedal edema, cyanosis, or clubbing.  NEUROLOGIC: unresponsive PSYCHIATRIC: Not responsive SKIN: No obvious rash, lesion, or ulcer.    LABORATORY PANEL:   CBC  Recent Labs Lab 12/06/15 0435  WBC 8.7  HGB 11.4*  HCT 34.9*  PLT 248    ------------------------------------------------------------------------------------------------------------------  Chemistries   Recent Labs Lab 12/04/15 1813 12/06/15 0435  NA 142 146*  K 4.6 4.8  CL 111 106  CO2 31 30  GLUCOSE 127* 136*  BUN 34* 35*  CREATININE 1.63* 2.08*  CALCIUM 8.1* 9.1  AST 27  --   ALT 22  --   ALKPHOS 62  --   BILITOT 0.2*  --    ------------------------------------------------------------------------------------------------------------------  Cardiac Enzymes  Recent Labs Lab 12/04/15 1813  TROPONINI 0.06*   ------------------------------------------------------------------------------------------------------------------  RADIOLOGY:  Dg Chest Portable 1 View  12/04/2015  CLINICAL DATA:  Cough and shortness of breath. EXAM: PORTABLE CHEST 1 VIEW COMPARISON:  10/04/2013 chest radiograph. FINDINGS: Stable cardiomediastinal silhouette with top-normal heart size. No pneumothorax. No pleural effusion. New patchy consolidation in the retrocardiac left lung base. No pulmonary edema. IMPRESSION: New patchy consolidation in the retrocardiac left lung base, nonspecific, differential includes aspiration or pneumonia. Electronically Signed   By: Delbert Phenix M.D.   On: 12/04/2015 18:23    EKG:   Orders placed or performed during the hospital encounter of 12/04/15  . ED EKG  . ED EKG  . EKG 12-Lead  . EKG 12-Lead    ASSESSMENT AND PLAN:   82y/o M with PMH of CHF, HTN, recent fall and elbow fracture or matured rehabilitation was brought in secondary to shortness of breath and hypoxemia. Has the following diagnosis:  # Sepsis # healthcare acquired pneumonia # Acute hypoxic respiratory failure # acute delirium-secondary to underlying pneumonia. # congestive heart failure # frequent falls with recent right elbow fracture # ARF # CKD stage 3  Discussed with daughters at bedside. They agree for comfort care. They're interested in  hospice. -hospice liaison contacted. At this time will start on comfort care here in the hospital awaiting hospice home meds. Discontinued blood draws. Discontinue antibiotics. -Started on morphine and Ativan.     All the records are reviewed and case discussed with Care Management/Social Workerr. Management plans discussed with the patient, family and they are in agreement.  CODE STATUS: DNR  TOTAL TIME TAKING CARE OF THIS PATIENT: 37 minutes.   POSSIBLE D/C IN 1-2 DAYS, DEPENDING ON CLINICAL CONDITION.   Cinderella Christoffersen M.D on 12/06/2015 at 1:22 PM  Between 7am to 6pm - Pager - [phone]  After 6pm go to [email or web address] - password [email or web address] [facility]  [place] Hospital[facility]  Office  [phone]  CC: Primary care physician; Lauro Regulus., MD

## 2015-12-06 NOTE — Clinical Documentation Improvement (Signed)
Hospitalists at [facility]  (Query responses must be documented in the current medical record, not on the CDI BPA form.)  To assist with accurate code assignment, please document if the diagnosis "Sepsis" was: - Present or Evolving on Admission, including any baseline or supportive data. - Not present on admission and occurred during the hospitalization - Unable to clinically determine  Clinica information/indicators: "Sepsis" is documented for the first time in the progress note by Dr. Nemiah Commander on 12/06/15  Please exercise your independent, professional judgment when responding. A specific answer is not anticipated or expected.   Thank You, Jerral Ralph RN BSN CCDS [phone] Health Information Management [place]

## 2015-12-06 NOTE — Progress Notes (Addendum)
Rapid response called on patient due to pt being unresponsive and initial sats in the 30's . Upon arrival pt had on a non rebreather and sats in 80's pt other vitals was stable pt was still unresponsive . Pt BS was checked wnl.pt was given ativan and morphine from his care nurse earlier in shift . Narcan .4 mg was administered per protocols with no response . Then physician arrived and ordered another narcan .4 mg which wasn't effective. Pt is a DNR and family was notified . No further needs from the Intensive Care unit.

## 2015-12-06 NOTE — Progress Notes (Signed)
Nutrition Brief Note  Chart reviewed. Pt now transitioning to comfort care.  Pt remains NPO. No further nutrition interventions warranted at this time.  Please re-consult as needed.   Joakim Huesman, RD, LDN Pager [phone] Weekend/On-Call Pager [phone]    

## 2015-12-06 NOTE — Progress Notes (Signed)
[place] referral received following conversation with Dr. Tressia Miners and patient's daughter Olivia Mackie. Patient was previously followed by [facility] at home with a  Hospice diagnosis of Protein calorie malnutrition.  He was discharged from hospice services on 11/19/2015 to pursue rehab at [facility].  Mr. Mayorquin was admitted to [facility] from [facility] on 2/1 for evaluation of altered mental status and decreased oxygen saturations. Chest xray revealed patchy infiltrate to the left lung and he was started on IV antibiotics and speech was consulted for a swallow eval. Per chart note review and discussion with attending physician Dr. Tressia Miners patient had a significant event requiring a Rapid Response being called overnight. He was unresponsive with oxygen saturations in the 30%. After being started on a non rebreather his saturations increased and he is currently on 3 liters nasal cannula, remains unresponsive.  Writer met in the room with patient's daughters Olivia Mackie and Langley Gauss to initiative education regarding hospice services, philosophy and team approach to care with good understanding voiced. Consents signed. Patient is requiring PRN IV lorazepam and IV morphine for symptom management of pain and anxiety. He remained unresponsive through out the visit, color pale, feet and face cool to touch. Family and attending aware that there ar no beds available at the [facility] today. Patient to be placed on the Hospice home waiting list. All updated patient information faxed to referral. [place] team all aware of plan for discharge to the hospice home when bed is available and if patient is stable for transport. Portable DNR to accompany patient if transferred. Thank you. Flo Shanks RN, BSN, [facility] [place], hospital Liaison [phone]

## 2015-12-07 MED ORDER — GLYCOPYRROLATE 0.2 MG/ML IJ SOLN
0.1000 mg | INTRAMUSCULAR | Status: DC | PRN
  Administered 2015-12-07: 0.1 mg via INTRAVENOUS
  Filled 2015-12-07 (×2): qty 1

## 2015-12-07 MED ORDER — SCOPOLAMINE 1 MG/3DAYS TD PT72
1.0000 | MEDICATED_PATCH | TRANSDERMAL | Status: DC
  Administered 2015-12-07: 1.5 mg via TRANSDERMAL
  Filled 2015-12-07: qty 1

## 2015-12-07 NOTE — Progress Notes (Signed)
[facility] Physicians - [place] at [facility]   PATIENT NAME: Jermaine Mcclain    MR#:  [id number]  DATE OF BIRTH:  [birth year]  SUBJECTIVE:  CHIEF COMPLAINT:   Chief Complaint  Patient presents with  . Shortness of Breath  . Altered Mental Status   - On comfort care. Patient is not responding. Has some chills and occasional muscle jerks. -Awaiting transfer to hospice home  REVIEW OF SYSTEMS:  Review of Systems  Unable to perform ROS: acuity of condition    DRUG ALLERGIES:  No Known Allergies  VITALS:  Blood pressure 139/68, pulse 77, temperature 97.8 F (36.6 C), temperature source Oral, resp. rate 20, height  (1.778 m), weight 77.111 kg (170 lb), SpO2 95 %.  PHYSICAL EXAMINATION:  Physical Exam  GENERAL:  82 y.o.-year-old elderly patient lying in the bed, appears critically ill and end of life.Marland Kitchen  EYES: Pupils equal, round, reactive to light and accommodation. No scleral icterus. Extraocular muscles intact.  HEENT: Head atraumatic, normocephalic. Oropharynx and nasopharynx clear but dry mucus membranes.Marland Kitchen  NECK:  Supple, no jugular venous distention. No thyroid enlargement, no tenderness.  LUNGS: Normal breath sounds bilaterally, no wheezing, rales or crepitation. Coarse rhonchi bilaterally, gurgling upper airway sounds. No use of accessory muscles of respiration.  CARDIOVASCULAR: S1, S2 normal. No rubs, or gallops. 3/6 systolic murmur present. ABDOMEN: Soft, nontender, nondistended. Bowel sounds present. No organomegaly or mass.  EXTREMITIES: No pedal edema, cyanosis, or clubbing.  NEUROLOGIC: unresponsive PSYCHIATRIC: Not responsive SKIN: No obvious rash, lesion, or ulcer.    LABORATORY PANEL:   CBC  Recent Labs Lab 12/06/15 0435  WBC 8.7  HGB 11.4*  HCT 34.9*  PLT 248   ------------------------------------------------------------------------------------------------------------------  Chemistries   Recent Labs Lab 12/04/15 1813  12/06/15 0435  NA 142 146*  K 4.6 4.8  CL 111 106  CO2 31 30  GLUCOSE 127* 136*  BUN 34* 35*  CREATININE 1.63* 2.08*  CALCIUM 8.1* 9.1  AST 27  --   ALT 22  --   ALKPHOS 62  --   BILITOT 0.2*  --    ------------------------------------------------------------------------------------------------------------------  Cardiac Enzymes  Recent Labs Lab 12/04/15 1813  TROPONINI 0.06*   ------------------------------------------------------------------------------------------------------------------  RADIOLOGY:  No results found.  EKG:   Orders placed or performed during the hospital encounter of 12/04/15  . ED EKG  . ED EKG  . EKG 12-Lead  . EKG 12-Lead    ASSESSMENT AND PLAN:   82y/o M with PMH of CHF, HTN, recent fall and elbow fracture or matured rehabilitation was brought in secondary to shortness of breath and hypoxemia. Has the following diagnosis:  # Sepsis-present on admission # healthcare acquired pneumonia # Acute hypoxic respiratory failure # acute delirium-secondary to underlying pneumonia. # congestive heart failure # frequent falls with recent right elbow fracture # ARF # CKD stage 3  Discontinued blood draws. Discontinue antibiotics. -Started on morphine and Ativan. -Awaiting transfer to hospice home    All the records are reviewed and case discussed with Care Management/Social Workerr. Management plans discussed with the patient, family and they are in agreement.  CODE STATUS: DNR  TOTAL TIME TAKING CARE OF THIS PATIENT: 25 minutes.     Doctor Sheahan M.D on 12/07/2015 at 9:11 AM  Between 7am to 6pm - Pager - [phone]  After 6pm go to [email or web address] - password [email or web address]  [place] Hospitalists  Office  [phone]  CC: Primary care physician; Lauro Regulus., MD

## 2015-12-08 MED ORDER — SCOPOLAMINE 1 MG/3DAYS TD PT72: 1.0000 | MEDICATED_PATCH | TRANSDERMAL | Status: AC

## 2015-12-08 MED ORDER — LORAZEPAM 2 MG/ML IJ SOLN: 2.0000 mg | INTRAMUSCULAR | Status: AC | PRN

## 2015-12-08 MED ORDER — MORPHINE SULFATE (CONCENTRATE) 10 MG/0.5ML PO SOLN: 10.0000 mg | ORAL | Status: AC | PRN

## 2015-12-08 NOTE — Progress Notes (Signed)
[facility] Physicians - [place] at [facility]   PATIENT NAME: Jermaine Mcclain    MR#:  [id number]  DATE OF BIRTH:  05-28-1915  SUBJECTIVE:  CHIEF COMPLAINT:   Chief Complaint  Patient presents with  . Shortness of Breath  . Altered Mental Status   - On comfort care. Appears comfortable. Not responding. -Left elbow cast has been cutoff per family's request. -Awaiting transfer to hospice home  REVIEW OF SYSTEMS:  Review of Systems  Unable to perform ROS: acuity of condition    DRUG ALLERGIES:  No Known Allergies  VITALS:  Blood pressure 130/62, pulse 84, temperature 98.9 F (37.2 C), temperature source Oral, resp. rate 22, height  (1.778 m), weight 77.111 kg (170 lb), SpO2 83 %.  PHYSICAL EXAMINATION:  Physical Exam  GENERAL:  82 y.o.-year-old elderly patient lying in the bed, appears critically ill and end of life.Marland Kitchen  EYES: Pupils equal, round, reactive to light and accommodation. No scleral icterus. Extraocular muscles intact.  HEENT: Head atraumatic, normocephalic. Oropharynx and nasopharynx clear but dry mucus membranes.Marland Kitchen  NECK:  Supple, no jugular venous distention. No thyroid enlargement, no tenderness.  LUNGS: Normal breath sounds bilaterally, no wheezing, rales or crepitation. Coarse rhonchi bilaterally, gurgling upper airway sounds. No use of accessory muscles of respiration.  CARDIOVASCULAR: S1, S2 normal. No rubs, or gallops. 3/6 systolic murmur present. ABDOMEN: Soft, nontender, nondistended. Bowel sounds present. No organomegaly or mass.  EXTREMITIES: No pedal edema, cyanosis, or clubbing.  NEUROLOGIC: unresponsive PSYCHIATRIC: Not responsive SKIN: No obvious rash, lesion, or ulcer.    LABORATORY PANEL:   CBC  Recent Labs Lab 12/06/15 0435  WBC 8.7  HGB 11.4*  HCT 34.9*  PLT 248   ------------------------------------------------------------------------------------------------------------------  Chemistries   Recent Labs Lab  12/04/15 1813 12/06/15 0435  NA 142 146*  K 4.6 4.8  CL 111 106  CO2 31 30  GLUCOSE 127* 136*  BUN 34* 35*  CREATININE 1.63* 2.08*  CALCIUM 8.1* 9.1  AST 27  --   ALT 22  --   ALKPHOS 62  --   BILITOT 0.2*  --    ------------------------------------------------------------------------------------------------------------------  Cardiac Enzymes  Recent Labs Lab 12/04/15 1813  TROPONINI 0.06*   ------------------------------------------------------------------------------------------------------------------  RADIOLOGY:  No results found.  EKG:   Orders placed or performed during the hospital encounter of 12/04/15  . ED EKG  . ED EKG  . EKG 12-Lead  . EKG 12-Lead    ASSESSMENT AND PLAN:   82y/o M with PMH of CHF, HTN, recent fall and elbow fracture or matured rehabilitation was brought in secondary to shortness of breath and hypoxemia. Has the following diagnosis:  # Sepsis-present on admission # healthcare acquired pneumonia # Acute hypoxic respiratory failure # acute delirium-secondary to underlying pneumonia. # congestive heart failure # frequent falls with recent right elbow fracture # ARF # CKD stage 3  Discontinued blood draws. Discontinue antibiotics. -on morphine and Ativan prn. Appears comfortable. -Awaiting transfer to hospice home    All the records are reviewed and case discussed with Care Management/Social Workerr. Management plans discussed with the patient, family and they are in agreement.  CODE STATUS: DNR  TOTAL TIME TAKING CARE OF THIS PATIENT: 15 minutes.     Erinn Mendosa M.D on 12/08/2015 at 11:21 AM  Between 7am to 6pm - Pager - [phone]  After 6pm go to [email or web address] - password [email or web address] [facility]  [place] [facility]  Office  [phone]  CC: Primary care physician; Lauro Regulus., MD

## 2015-12-08 NOTE — Progress Notes (Signed)
Clinical Social Worker informed by Enid Baas, MD that patient is medically stabel to discharge to Hospice Home, Patient's family at bedside and in a agreement with plan.  Call from Hospice Home to confirm that patient's bed is ready.  Per Laurin family has completed all needed paper work.  Laurin provided patient's room number 9 and number to call for report [phone] . All discharge information faxed to  Facility. DNR and Rx's added to discharge packet.   RN will call report and patient will discharge to Hospice Home via EMS.  Sammuel Hines. LCSWA Clinical Social Work Department [phone] 12:59 PM

## 2015-12-08 NOTE — Discharge Summary (Signed)
[facility] - [place] at [facility]   PATIENT NAME: Jermaine Mcclain    MR#:  [id number]  DATE OF BIRTH:  08/08/1915  DATE OF ADMISSION:  12/04/2015 ADMITTING PHYSICIAN: Ramonita Lab, MD  DATE OF DISCHARGE: 12/08/2015  PRIMARY CARE PHYSICIAN: Lauro Regulus., MD    ADMISSION DIAGNOSIS:  Hypoxia [R09.02] Healthcare-associated pneumonia [J18.9] Altered mental status, unspecified altered mental status type [R41.82]  DISCHARGE DIAGNOSIS:  Active Problems:   Healthcare-associated pneumonia   SECONDARY DIAGNOSIS:   Past Medical History  Diagnosis Date  . Hypertension   . CHF (congestive heart failure) (HCC)   . Thyroid disease   . Arm fracture left    HOSPITAL COURSE:   82y/o M with PMH of CHF, HTN, recent fall and elbow fracture or matured rehabilitation was brought in secondary to shortness of breath and hypoxemia. Has the following diagnosis:  # Sepsis-present on admission # healthcare acquired pneumonia # Acute hypoxic respiratory failure # acute delirium-secondary to underlying pneumonia. # congestive heart failure # frequent falls with recent right elbow fracture # ARF # CKD stage 3  -on morphine and Ativan prn. Appears comfortable. -Being transferred to hospice home  DISCHARGE CONDITIONS:   Critical  CONSULTS OBTAINED:  Treatment Team:  Ramonita Lab, MD  DRUG ALLERGIES:  No Known Allergies  DISCHARGE MEDICATIONS:   Current Discharge Medication List    START taking these medications   Details  LORazepam (ATIVAN) 2 MG/ML injection Inject 1 mL (2 mg total) into the vein every 3 (three) hours as needed. Qty: 10 mL, Refills: 0    Morphine Sulfate (MORPHINE CONCENTRATE) 10 MG/0.5ML SOLN concentrated solution Take 0.5 mLs (10 mg total) by mouth every 2 (two) hours as needed for moderate pain or shortness of breath. Qty: 15 mL, Refills: 0    scopolamine (TRANSDERM-SCOP) 1 MG/3DAYS Place 1 patch (1.5 mg total) onto the skin  every 3 (three) days. Qty: 5 patch, Refills: 0      STOP taking these medications     acetaminophen (TYLENOL) 325 MG tablet      aspirin EC 81 MG tablet      levothyroxine (SYNTHROID, LEVOTHROID) 125 MCG tablet      mirtazapine (REMERON) 7.5 MG tablet      Multiple Vitamin (MULTIVITAMIN WITH MINERALS) TABS tablet      oxyCODONE-acetaminophen (ROXICET) 5-325 MG tablet      vitamin B-12 (CYANOCOBALAMIN) 500 MCG tablet      cephALEXin (KEFLEX) 250 MG capsule          DISCHARGE INSTRUCTIONS:   Patient is being transferred to Hospice home.  If you experience worsening of your admission symptoms, develop shortness of breath, life threatening emergency, suicidal or homicidal thoughts you must seek medical attention immediately by calling 911 or calling your MD immediately  if symptoms less severe.  You Must read complete instructions/literature along with all the possible adverse reactions/side effects for all the Medicines you take and that have been prescribed to you. Take any new Medicines after you have completely understood and accept all the possible adverse reactions/side effects.   Please note  You were cared for by a hospitalist during your hospital stay. If you have any questions about your discharge medications or the care you received while you were in the hospital after you are discharged, you can call the unit and asked to speak with the hospitalist on call if the hospitalist that took care of you is not available. Once you are discharged, your primary care  physician will handle any further medical issues. Please note that NO REFILLS for any discharge medications will be authorized once you are discharged, as it is imperative that you return to your primary care physician (or establish a relationship with a primary care physician if you do not have one) for your aftercare needs so that they can reassess your need for medications and monitor your lab values.    Today    CHIEF COMPLAINT:   Chief Complaint  Patient presents with  . Shortness of Breath  . Altered Mental Status    VITAL SIGNS:  Blood pressure 123/70, pulse 92, temperature 98.9 F (37.2 C), temperature source Oral, resp. rate 22, height  (1.778 m), weight 77.111 kg (170 lb), SpO2 84 %.  I/O:  No intake or output data in the 24 hours ending 12/08/15 1243  PHYSICAL EXAMINATION:   Physical Exam  GENERAL: 82 y.o.-year-old elderly patient lying in the bed, appears critically ill and end of life.Marland Kitchen  EYES: Pupils equal, round, reactive to light and accommodation. No scleral icterus. Extraocular muscles intact.  HEENT: Head atraumatic, normocephalic. Oropharynx and nasopharynx clear but dry mucus membranes.Marland Kitchen  NECK: Supple, no jugular venous distention. No thyroid enlargement, no tenderness.  LUNGS: Normal breath sounds bilaterally, no wheezing, rales or crepitation. Coarse rhonchi bilaterally, gurgling upper airway sounds. No use of accessory muscles of respiration.  CARDIOVASCULAR: S1, S2 normal. No rubs, or gallops. 3/6 systolic murmur present. ABDOMEN: Soft, nontender, nondistended. Bowel sounds present. No organomegaly or mass.  EXTREMITIES: No pedal edema, cyanosis, or clubbing.  NEUROLOGIC: unresponsive PSYCHIATRIC: Not responsive SKIN: No obvious rash, lesion, or ulcer.   DATA REVIEW:   CBC  Recent Labs Lab 12/06/15 0435  WBC 8.7  HGB 11.4*  HCT 34.9*  PLT 248    Chemistries   Recent Labs Lab 12/04/15 1813 12/06/15 0435  NA 142 146*  K 4.6 4.8  CL 111 106  CO2 31 30  GLUCOSE 127* 136*  BUN 34* 35*  CREATININE 1.63* 2.08*  CALCIUM 8.1* 9.1  AST 27  --   ALT 22  --   ALKPHOS 62  --   BILITOT 0.2*  --     Cardiac Enzymes  Recent Labs Lab 12/04/15 1813  TROPONINI 0.06*    Microbiology Results  Results for orders placed or performed during the hospital encounter of 12/04/15  Culture, blood (routine x 2)     Status: None (Preliminary  result)   Collection Time: 12/04/15  6:13 PM  Result Value Ref Range Status   Specimen Description BLOOD RIGHT ASSIST CONTROL  Final   Special Requests   Final    BOTTLES DRAWN AEROBIC AND ANAEROBIC  2CC AERO, 1CC ANA   Culture NO GROWTH 3 DAYS  Final   Report Status PENDING  Incomplete  Culture, blood (routine x 2)     Status: None (Preliminary result)   Collection Time: 12/04/15  7:03 PM  Result Value Ref Range Status   Specimen Description BLOOD RIGHT FATTY CASTS  Final   Special Requests BOTTLES DRAWN AEROBIC AND ANAEROBIC 7CC  Final   Culture NO GROWTH 3 DAYS  Final   Report Status PENDING  Incomplete  MRSA PCR Screening     Status: Abnormal   Collection Time: 12/05/15 12:04 AM  Result Value Ref Range Status   MRSA by PCR POSITIVE (A) NEGATIVE Final    Comment:        The GeneXpert MRSA Assay (FDA approved for NASAL specimens only),  is one component of a comprehensive MRSA colonization surveillance program. It is not intended to diagnose MRSA infection nor to guide or monitor treatment for MRSA infections. CRITICAL RESULT CALLED TO, READ BACK BY AND VERIFIED WITH: WHITNEY BIGELOW AT 0528 ON 12/05/15.Marland KitchenMarland KitchenMMC     RADIOLOGY:  No results found.  EKG:   Orders placed or performed during the hospital encounter of 12/04/15  . ED EKG  . ED EKG  . EKG 12-Lead  . EKG 12-Lead      Management plans discussed with the patient, family and they are in agreement.  CODE STATUS:     Code Status Orders        Start     Ordered   12/05/15 1310  Do not attempt resuscitation (DNR)   Continuous    Question Answer Comment  In the event of cardiac or respiratory ARREST Do not call a "code blue"   In the event of cardiac or respiratory ARREST Do not perform Intubation, CPR, defibrillation or ACLS   In the event of cardiac or respiratory ARREST Use medication by any route, position, wound care, and other measures to relive pain and suffering. May use oxygen, suction and manual  treatment of airway obstruction as needed for comfort.      12/05/15 1309    Code Status History    Date Active Date Inactive Code Status Order ID Comments User Context   12/04/2015  9:10 PM 12/05/2015  1:09 PM Full Code [id number]  Ramonita Lab, MD Inpatient   11/16/2015  2:36 PM 11/19/2015  6:19 PM Full Code [id number]  Katha Hamming, MD ED    Advance Directive Documentation        Most Recent Value   Type of Advance Directive  Healthcare Power of Attorney   Pre-existing out of facility DNR order (yellow form or pink MOST form)     "MOST" Form in Place?        TOTAL TIME TAKING CARE OF THIS PATIENT: 37 minutes.    Tayja Manzer M.D on 12/08/2015 at 12:43 PM  Between 7am to 6pm - Pager - [phone]  After 6pm go to [email or web address] - password [email or web address] [facility]  [facility]  Office  [phone]  CC: Primary care physician; Lauro Regulus., MD

## 2015-12-08 NOTE — Progress Notes (Signed)
Bed available at hospice home and patient will be discharged to hospice home today

## 2015-12-10 LAB — CULTURE, BLOOD (ROUTINE X 2)
CULTURE: NO GROWTH
Culture: NO GROWTH

## 2015-12-11 LAB — MISC LABCORP TEST (SEND OUT): Labcorp test code: 9985

## 2016-01-01 DEATH — deceased

## 2017-05-29 IMAGING — CR DG WRIST COMPLETE 3+V*L*
4 series · 4 of 4 positions shown · non-contrast
Comparison: None.

CLINICAL DATA: Fell yesterday. Pain and soreness over the left hand
and wrist. Bruising.

EXAM:
LEFT WRIST - COMPLETE 3+ VIEW

[wrist pa]
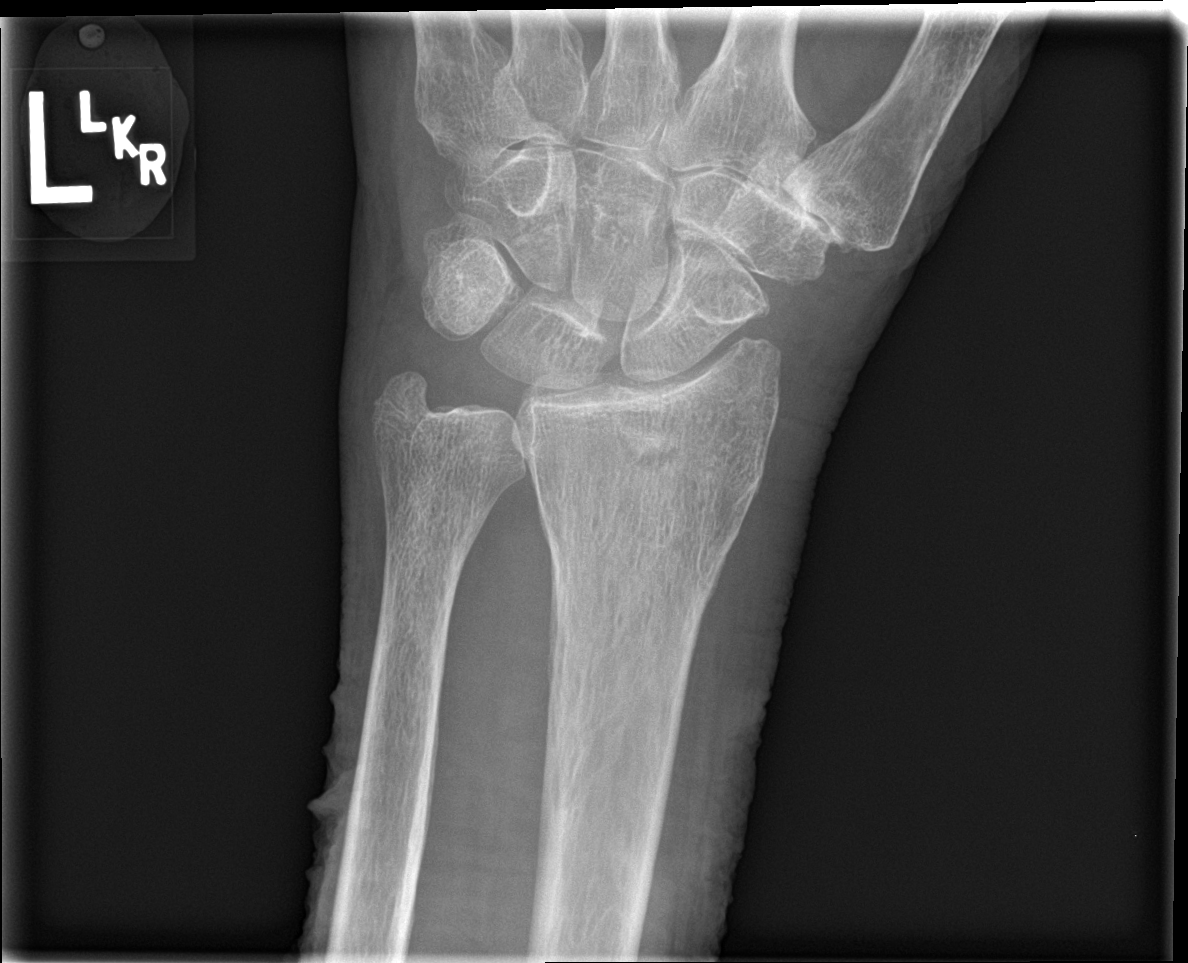

[wrist obl]
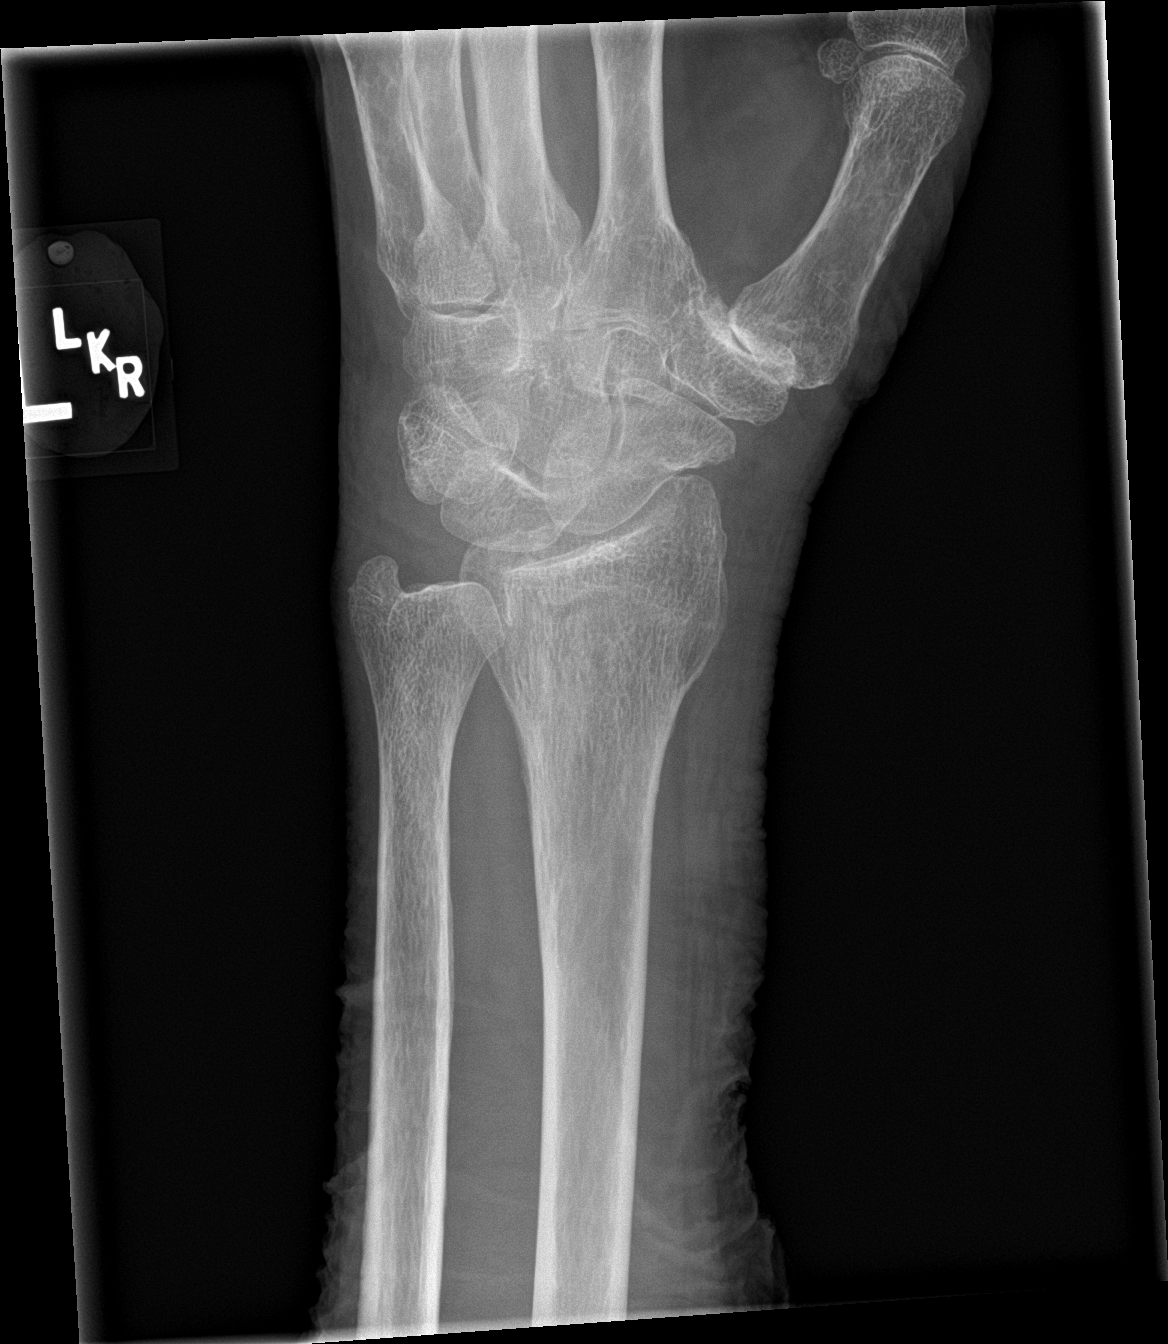

[wrist lat]
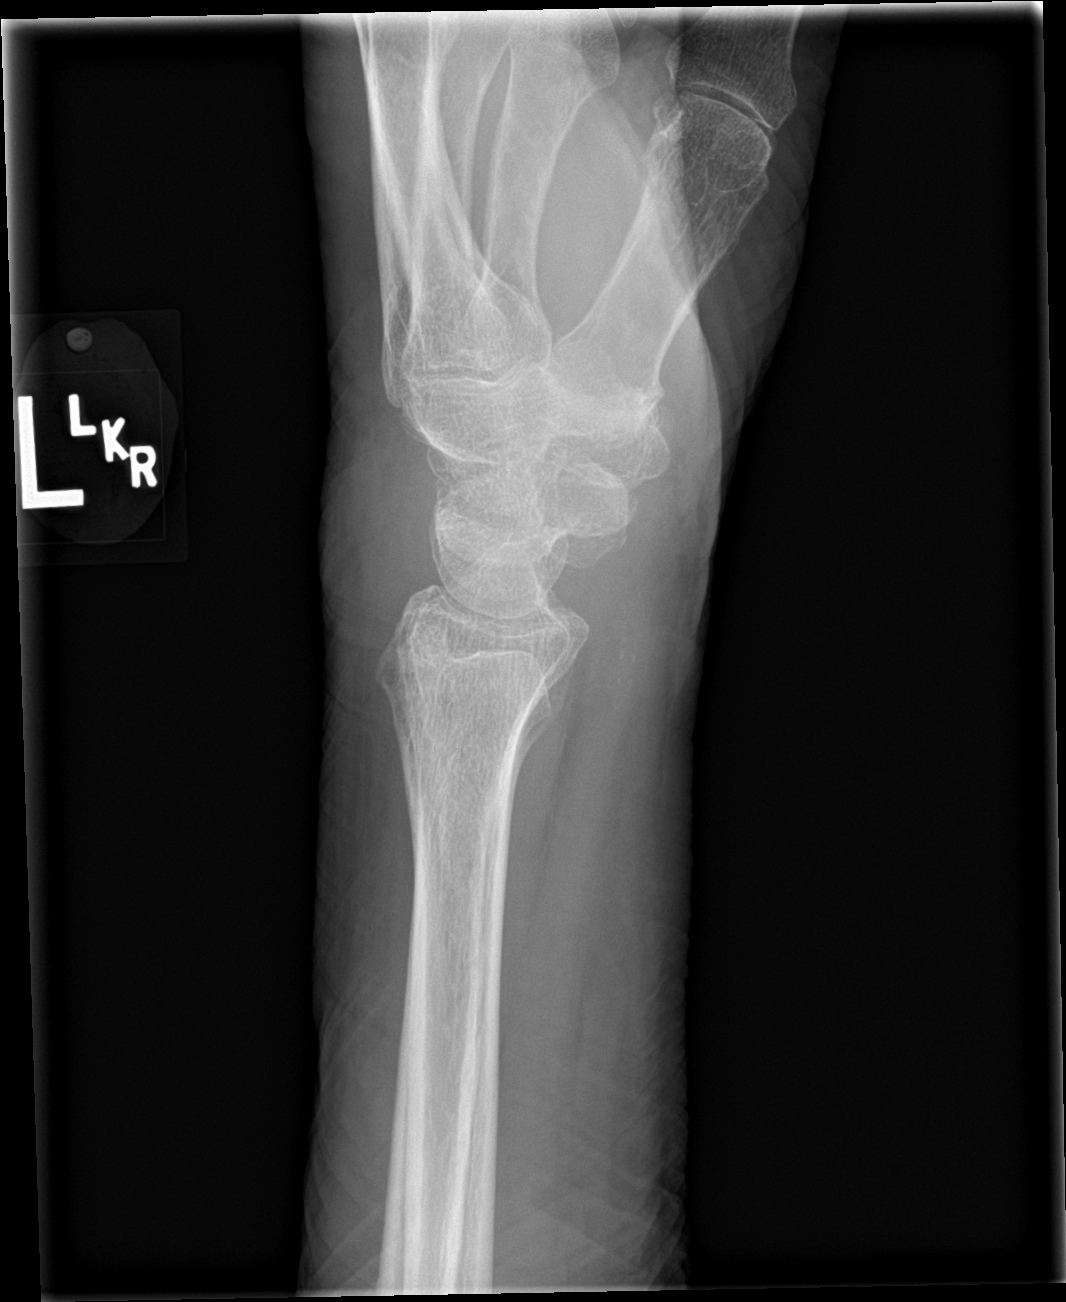

[navicular]
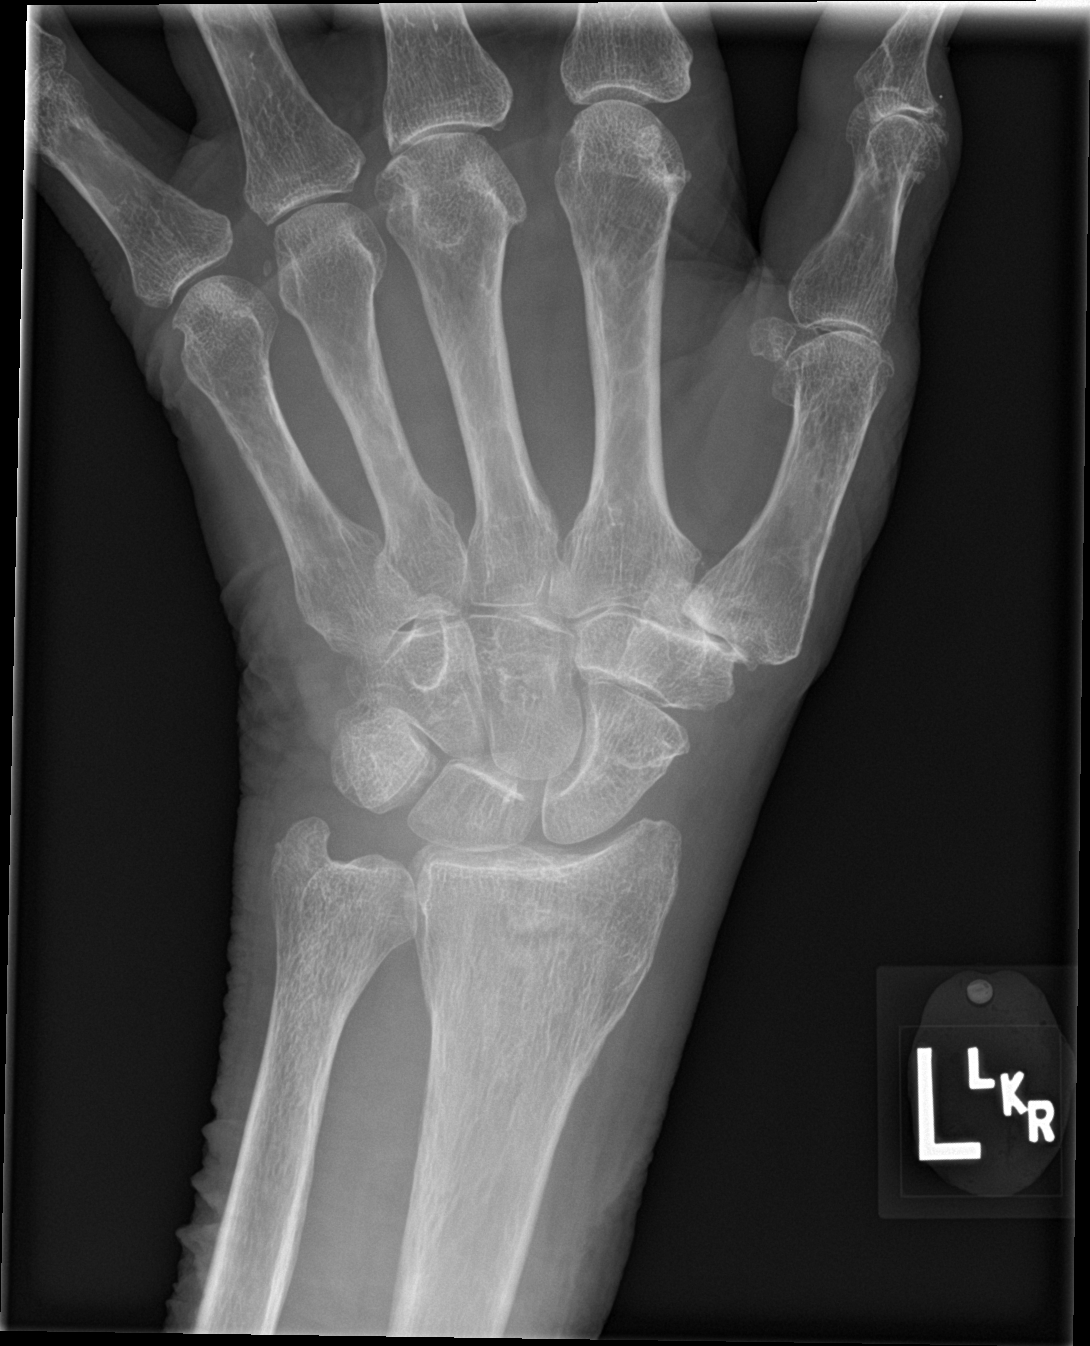

[4 of 4 positions shown; findings below may reference images not displayed]

FINDINGS: Although subtle, there is a nondisplaced, non depressed and
nonangulated fracture of the distal radial metaphysis. There is no
comminution.

There is no other evidence of a fracture.

Joints are normally spaced and aligned. Bones are diffusely
demineralized.

There is diffuse soft tissue swelling.
IMPRESSION: 1. Nondisplaced, non comminuted, nondepressed and nonangulated
fracture of the distal radial metaphysis.

## 2017-06-14 IMAGING — CR DG HAND COMPLETE 3+V*R*
1 series · 3 of 3 positions shown · non-contrast
Comparison: None.

CLINICAL DATA: Pt from [HOSPITAL[facility] plaze via EMS. Family reports pt
has been falling more frequently. Reports they found him in his room
on the floor, unknown how long he'd been down. Fractured R. elbow
from previous fall. Pint

EXAM:
RIGHT HAND - COMPLETE 3+ VIEW

[Series 1: pa · 0.17mm/px · 3 of 3 slices shown]
[im 1/3]
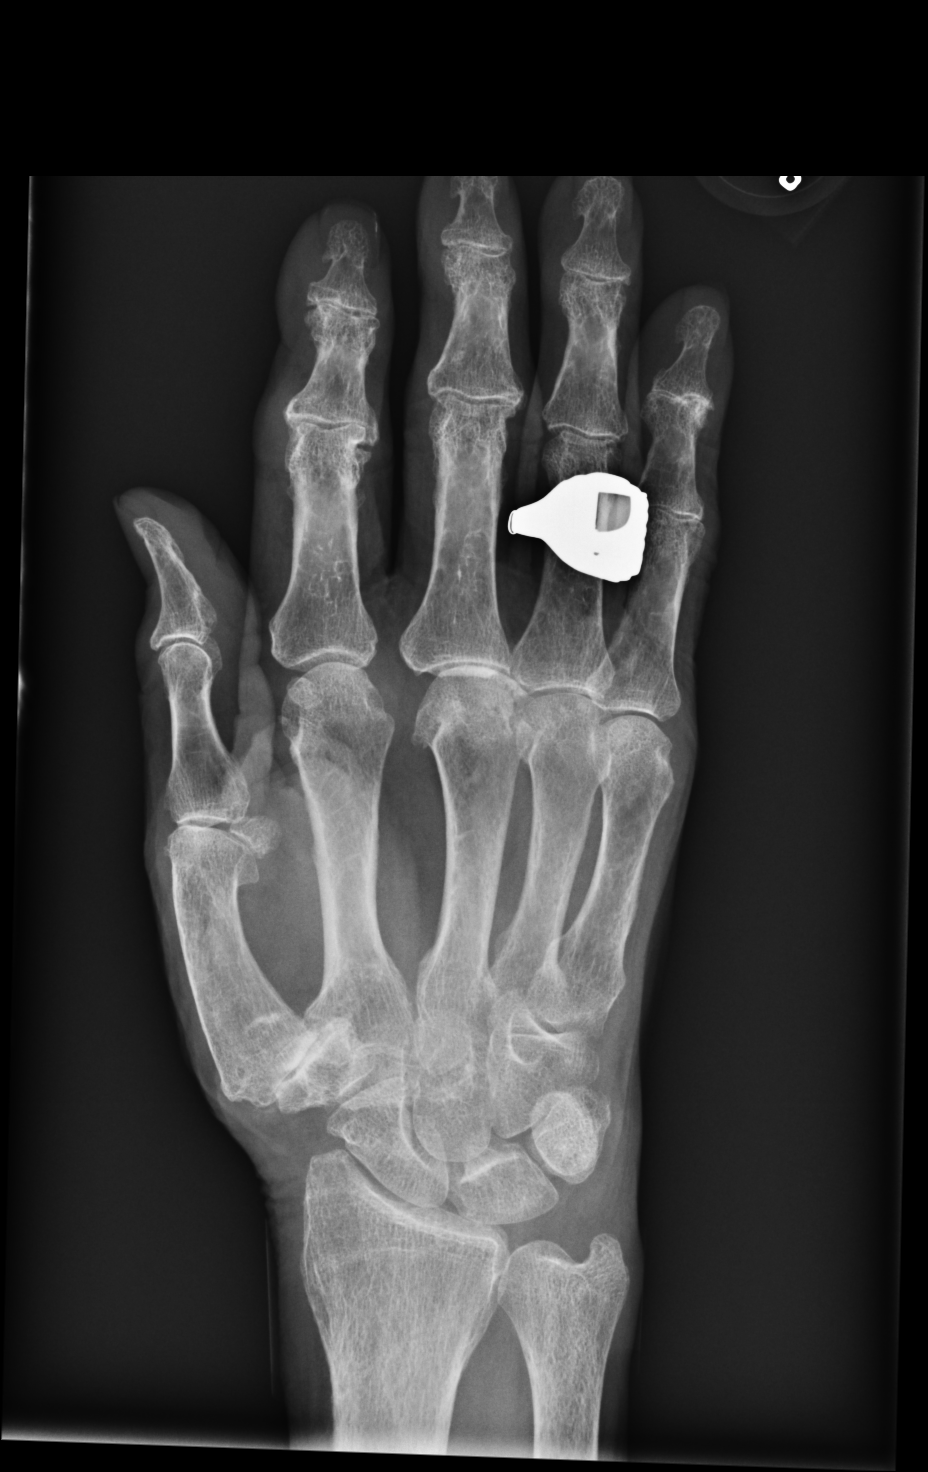
[im 2/3]
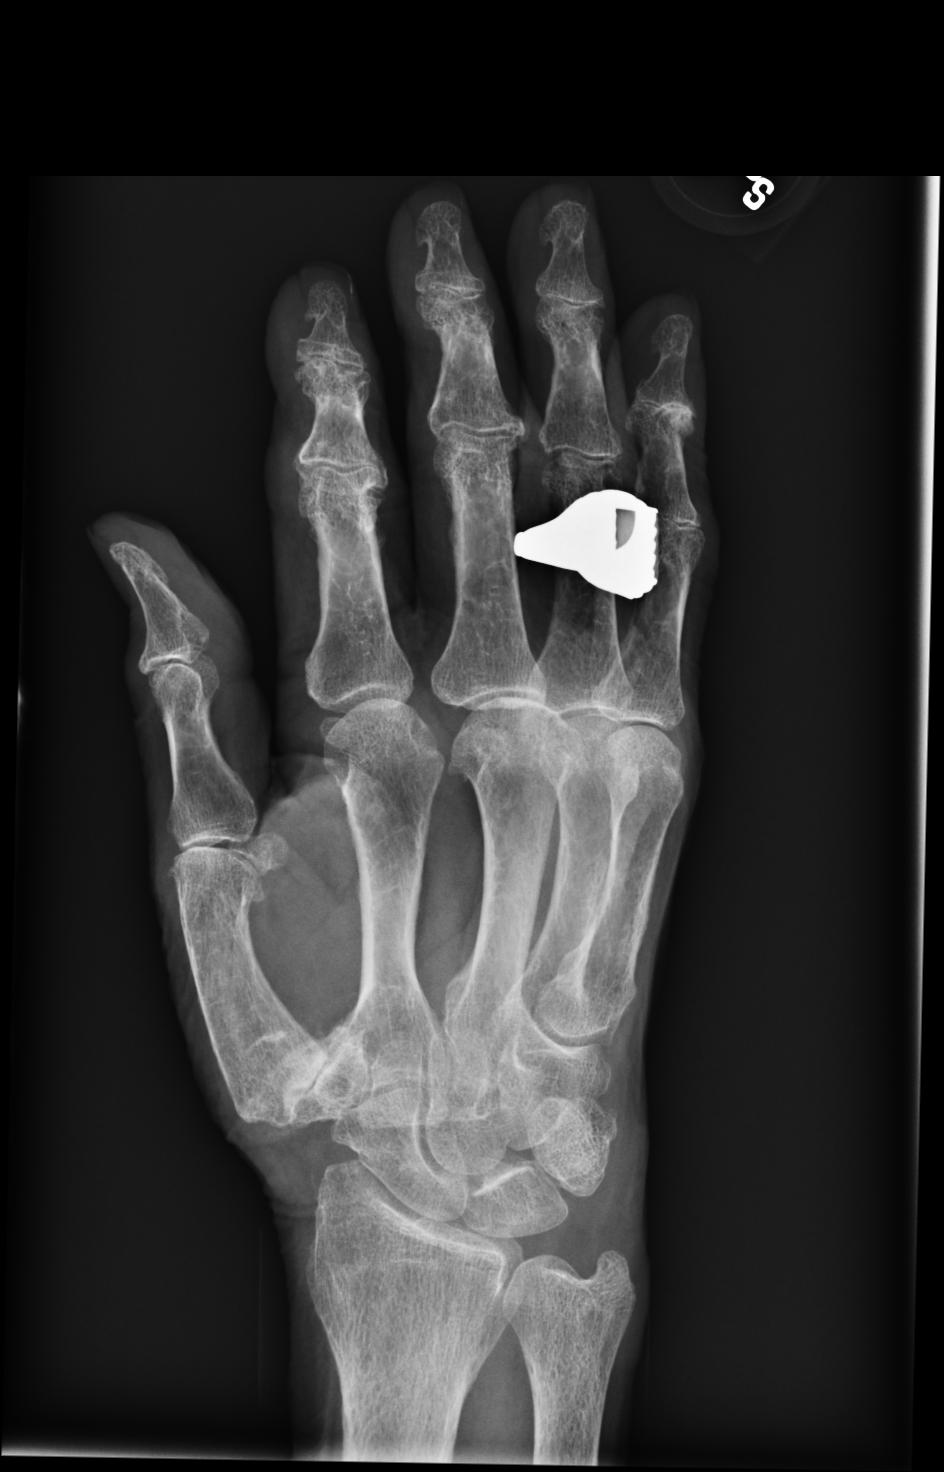
[im 3/3]
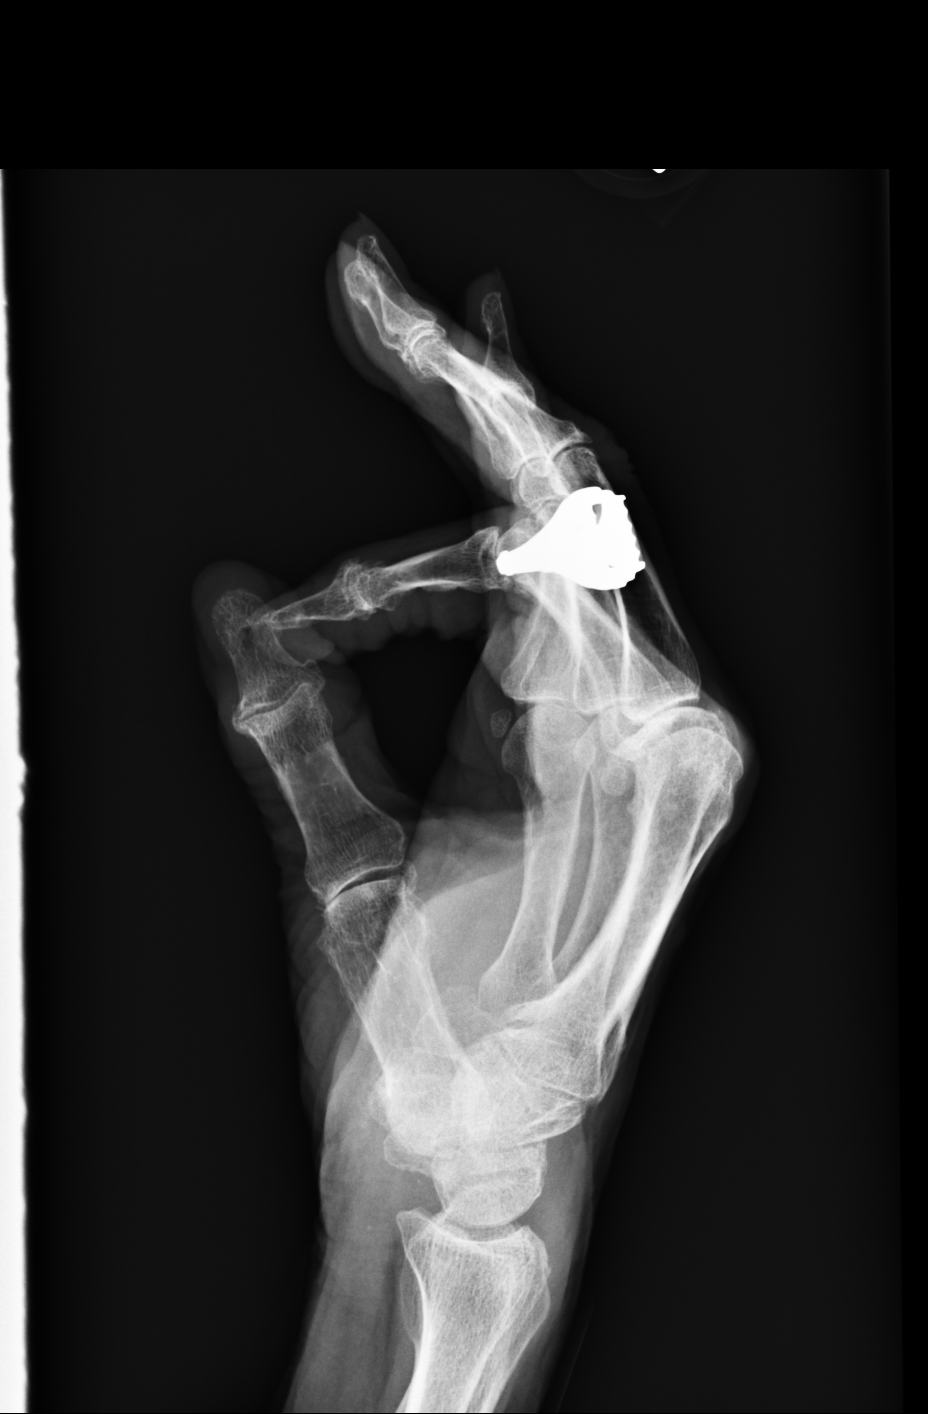

[3 of 3 positions shown; findings below may reference images not displayed]

FINDINGS: No evidence of fracture of the carpal or metacarpal bones.
Radiocarpal joint is intact. Phalanges are normal. No soft tissue
injury.
IMPRESSION: No fracture or dislocation.
